# Patient Record
Sex: Female | Born: 1955 | Race: Black or African American | Hispanic: No | Marital: Married | Smoking: Former smoker
Health system: Southern US, Community
[De-identification: ages and names within clinical notes are randomized; demographics above are authoritative.]

## PROBLEM LIST (undated history)

## (undated) DIAGNOSIS — R161 Splenomegaly, not elsewhere classified: Secondary | ICD-10-CM

## (undated) DIAGNOSIS — D571 Sickle-cell disease without crisis: Secondary | ICD-10-CM

## (undated) DIAGNOSIS — J439 Emphysema, unspecified: Secondary | ICD-10-CM

## (undated) DIAGNOSIS — D649 Anemia, unspecified: Secondary | ICD-10-CM

## (undated) DIAGNOSIS — J329 Chronic sinusitis, unspecified: Secondary | ICD-10-CM

## (undated) DIAGNOSIS — I2699 Other pulmonary embolism without acute cor pulmonale: Secondary | ICD-10-CM

## (undated) DIAGNOSIS — H409 Unspecified glaucoma: Secondary | ICD-10-CM

## (undated) DIAGNOSIS — I10 Essential (primary) hypertension: Secondary | ICD-10-CM

## (undated) HISTORY — PX: ABDOMINAL HYSTERECTOMY: SHX81

## (undated) HISTORY — PX: CHOLECYSTECTOMY: SHX55

---

## 2006-06-16 DIAGNOSIS — R161 Splenomegaly, not elsewhere classified: Secondary | ICD-10-CM

## 2006-06-16 HISTORY — DX: Splenomegaly, not elsewhere classified: R16.1

## 2008-06-16 DIAGNOSIS — H409 Unspecified glaucoma: Secondary | ICD-10-CM

## 2008-06-16 HISTORY — DX: Unspecified glaucoma: H40.9

## 2011-12-21 ENCOUNTER — Inpatient Hospital Stay (HOSPITAL_COMMUNITY)
Admission: AD | Admit: 2011-12-21 | Discharge: 2011-12-21 | Disposition: A | Discharge status: Home / Self Care | Payer: Managed Care, Other (non HMO) | Source: Ambulatory Visit | Attending: Obstetrics & Gynecology | Admitting: Obstetrics & Gynecology

## 2011-12-21 ENCOUNTER — Encounter (HOSPITAL_COMMUNITY): Payer: Self-pay

## 2011-12-21 DIAGNOSIS — M549 Dorsalgia, unspecified: Secondary | ICD-10-CM

## 2011-12-21 HISTORY — DX: Chronic sinusitis, unspecified: J32.9

## 2011-12-21 HISTORY — DX: Other pulmonary embolism without acute cor pulmonale: I26.99

## 2011-12-21 HISTORY — DX: Essential (primary) hypertension: I10

## 2011-12-21 LAB — URINALYSIS, ROUTINE W REFLEX MICROSCOPIC
Glucose, UA: NEGATIVE mg/dL
Ketones, ur: NEGATIVE mg/dL
Leukocytes, UA: NEGATIVE
Protein, ur: NEGATIVE mg/dL
Urobilinogen, UA: 1 mg/dL (ref 0.0–1.0)

## 2011-12-21 MED ORDER — OXYCODONE-ACETAMINOPHEN 5-325 MG PO TABS
2.0000 | ORAL_TABLET | Freq: Once | ORAL | Status: AC
  Administered 2011-12-21: 2 via ORAL
  Filled 2011-12-21: qty 2

## 2011-12-21 MED ORDER — OXYCODONE-ACETAMINOPHEN 5-325 MG PO TABS: 2.0000 | ORAL_TABLET | ORAL | Status: AC | PRN

## 2011-12-21 MED ORDER — CYCLOBENZAPRINE HCL 10 MG PO TABS: 10.0000 mg | ORAL_TABLET | Freq: Two times a day (BID) | ORAL | Status: AC | PRN

## 2011-12-21 MED ORDER — ONDANSETRON 8 MG PO TBDP
8.0000 mg | ORAL_TABLET | Freq: Once | ORAL | Status: AC
  Administered 2011-12-21: 8 mg via ORAL
  Filled 2011-12-21: qty 1

## 2011-12-21 NOTE — MAU Provider Note (Signed)
History     CSN: [id number]  Arrival date & time 12/21/11  4098   None     Chief Complaint  Patient presents with  . Back Pain  . Leg Pain    HPI Alexandra Andrade is a 56 y.o. female who presents to MAU for back pain. Sudden onset of pain yesterday when getting out of the shower. The pain is located in the right lumbar area and radiated down the right leg. She rates the pain as 10/10. No loss of control of bladder or bowels. Patient thought she may have a kidney infection so started taking Amoxicillin. Denies UTI symptoms. Hx of arthritis in both hips. Patient takes Warfarin as a result of PE in February. The history was provided by the patient.  Past Medical History  Diagnosis Date  . Pulmonary embolism   . Hypertension   . Recurrent sinus infections     Past Surgical History  Procedure Date  . Abdominal hysterectomy   . Cholecystectomy     No family history on file.  History  Substance Use Topics  . Smoking status: Never Smoker   . Smokeless tobacco: Not on file  . Alcohol Use: No    OB History    Grav Para Term Preterm Abortions TAB SAB Ect Mult Living   1 1        1       Review of Systems  Constitutional: Negative for fever, chills, diaphoresis and fatigue.  HENT: Negative for ear pain, congestion, sore throat, facial swelling, neck pain, neck stiffness, dental problem and sinus pressure.   Eyes: Negative for photophobia, pain, discharge and visual disturbance.  Respiratory: Negative for cough, chest tightness, shortness of breath and wheezing.   Cardiovascular: Negative for chest pain and palpitations.  Gastrointestinal: Negative for nausea, vomiting, abdominal pain, diarrhea, constipation and abdominal distention.  Genitourinary: Negative for dysuria, urgency, frequency, flank pain, vaginal bleeding, vaginal discharge and difficulty urinating.  Musculoskeletal: Positive for back pain. Negative for myalgias and gait problem.  Skin: Negative for color change  and rash.  Neurological: Negative for dizziness, speech difficulty, weakness, light-headedness, numbness and headaches.  Psychiatric/Behavioral: Negative for confusion and agitation. The patient is not nervous/anxious.     Allergies  Review of patient's allergies indicates no known allergies.  Home Medications  No current outpatient prescriptions on file.  BP 170/90  Pulse 46  Temp 98.9 F (37.2 C) (Oral)  Resp 18  Physical Exam  Nursing note and vitals reviewed. Constitutional: She is oriented to person, place, and time. She appears well-developed and well-nourished. No distress.  HENT:  Head: Normocephalic.  Eyes: EOM are normal.  Neck: Neck supple.  Cardiovascular: Normal rate.   Pulmonary/Chest: Effort normal.  Abdominal: Soft. There is no tenderness.  Musculoskeletal:       Pain with ROM of back and legs. Difficulty with ambulation due to pain. Using a cain for assistance with walking. Radial and pedal pulses equal and strong. Adequate circulation.  Neurological: She is alert and oriented to person, place, and time. She has normal strength and normal reflexes. No cranial nerve deficit or sensory deficit.  Skin: Skin is warm and dry.  Psychiatric: She has a normal mood and affect. Her behavior is normal. Judgment and thought content normal.   Results for orders placed during the hospital encounter of 12/21/11 (from the past 24 hour(s))  URINALYSIS, ROUTINE W REFLEX MICROSCOPIC     Status: Abnormal   Collection Time   12/21/11  9:50 AM  Component Value Range   Color, Urine YELLOW  YELLOW   APPearance CLEAR  CLEAR   Specific Gravity, Urine <1.005 (*) 1.005 - 1.030   pH 6.0  5.0 - 8.0   Glucose, UA NEGATIVE  NEGATIVE mg/dL   Hgb urine dipstick NEGATIVE  NEGATIVE   Bilirubin Urine NEGATIVE  NEGATIVE   Ketones, ur NEGATIVE  NEGATIVE mg/dL   Protein, ur NEGATIVE  NEGATIVE mg/dL   Urobilinogen, UA 1.0  0.0 - 1.0 mg/dL   Nitrite NEGATIVE  NEGATIVE   Leukocytes, UA  NEGATIVE  NEGATIVE   Assessment: Lower lumbar pain   Sciatica   Arthritis by history  Plan:  Percocet 5/325, 2 tabs now   Zofran 8 mg. ODT now   Rx Percocet   Rx Flexeril   Follow up with Ortho if symptoms worsen before then patient will go to [facility] or [place] ED for further evaluation.  I discussed with the patient in detail clinical findings and need for follow up. Patient voices understanding.   Re evaluation: patient beginning to feel better with heat and Percocet Will d/c home with Rx ED Course  Procedures   MDM

## 2011-12-21 NOTE — MAU Note (Signed)
Mid to lower back pain for several days thought was kidney infection took an antibiotic left over from dental work, pain was worse yesterday, patient groaning with pain.

## 2014-04-17 ENCOUNTER — Encounter (HOSPITAL_COMMUNITY): Payer: Self-pay

## 2015-03-12 ENCOUNTER — Other Ambulatory Visit: Payer: Self-pay | Admitting: Family Medicine

## 2015-03-12 DIAGNOSIS — Z1231 Encounter for screening mammogram for malignant neoplasm of breast: Secondary | ICD-10-CM

## 2015-08-01 ENCOUNTER — Encounter: Payer: Self-pay | Admitting: Podiatry

## 2015-08-01 ENCOUNTER — Ambulatory Visit (INDEPENDENT_AMBULATORY_CARE_PROVIDER_SITE_OTHER): Payer: Managed Care, Other (non HMO) | Admitting: Podiatry

## 2015-08-01 VITALS — BP 136/79 | HR 59 | Resp 14

## 2015-08-01 DIAGNOSIS — B351 Tinea unguium: Secondary | ICD-10-CM | POA: Diagnosis not present

## 2015-08-01 DIAGNOSIS — L84 Corns and callosities: Secondary | ICD-10-CM

## 2015-08-01 DIAGNOSIS — L600 Ingrowing nail: Secondary | ICD-10-CM

## 2015-08-01 NOTE — Progress Notes (Signed)
   Subjective:    Patient ID: Alexandra Andrade, female    DOB: 10-13-1955, 60 y.o.   MRN: [id number]  HPI this patient presents to the office with chief complaint of pain from her big toenail, right foot growing into the corners. She says that she constantly has to work at cleaning out the corners and the nail always regrows and grows further into the corner itself. She presents the office today for an evaluation of this nail. She also says that she has multiple calluses on the bottom of both feet, which she works on herself with the pumice stone. She presents to the office for an evaluation and treatment    Review of Systems     Objective:   Physical Exam GENERAL APPEARANCE: Alert, conversant. Appropriately groomed. No acute distress.  VASCULAR: Pedal pulses palpable at  [facility] and PT bilateral.  Capillary refill time is immediate to all digits,  Normal temperature gradient.  Digital hair growth is present bilateral  NEUROLOGIC: sensation is normal to 5.07 monofilament at 5/5 sites bilateral.  Light touch is intact bilateral, Muscle strength normal.  MUSCULOSKELETAL: acceptable muscle strength, tone and stability bilateral.  Intrinsic muscluature intact bilateral.  Rectus appearance of foot and digits noted bilateral. 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    DERMATOLOGIC: skin color, texture, and turgor are within normal limits.  No preulcerative lesions or ulcers  are seen, no interdigital maceration noted.  No open lesions present.  Digital nails are asymptomatic. No drainage noted. Callus sub 1st MPJ and pinch callus. NAILS  Marked incurvation medial and lateral borders right great toe.  Thick disfigured discolored nail right hallux.  No paronychia noted.       Assessment & Plan:  Onychomycosis Hallux Right  Plantar tyloma  IE  Nail surgery.  Treatment options and alternatives discussed.  Recommended permanent phenol matrixectomy and patient agreed.  Right hallux was prepped with alcohol  and a toe block of 3cc of 2% lidocaine plain was administered in a digital toe block. .  The toe was then prepped with betadine solution .  The offending nail border was then excised along both medial and lateral borders. and matrix tissue exposed.  Phenol was then applied to the matrix tissue followed by an alcohol wash.  Antibiotic ointment and a dry sterile dressing was applied.  The patient was dispensed instructions for aftercare. RTC 1 week.   Helane Gunther DPM     Helane Gunther DPM

## 2015-08-09 ENCOUNTER — Encounter: Payer: Self-pay | Admitting: Podiatry

## 2015-08-09 ENCOUNTER — Ambulatory Visit (INDEPENDENT_AMBULATORY_CARE_PROVIDER_SITE_OTHER): Payer: Managed Care, Other (non HMO) | Admitting: Podiatry

## 2015-08-09 VITALS — BP 104/63 | HR 63 | Resp 12

## 2015-08-09 DIAGNOSIS — Z09 Encounter for follow-up examination after completed treatment for conditions other than malignant neoplasm: Secondary | ICD-10-CM

## 2015-08-09 DIAGNOSIS — L84 Corns and callosities: Secondary | ICD-10-CM

## 2015-08-09 NOTE — Progress Notes (Signed)
Patient ID: Alexandra Andrade, female   DOB: 1956/04/14, 60 y.o.   MRN: [id number] This patient returns to the office following nail surgery one week ago.  The patient says toe has been soaked and bandaged as directed.  There has been improvement of the toe since the surgery has been performed. Patient says there is callus on both sides of nail which she says needs to be removed. The patient presents for continued evaluation and treatment.  GENERAL APPEARANCE: Alert, conversant. Appropriately groomed. No acute distress.  VASCULAR: Pedal pulses palpable at  [facility] and PT bilateral.  Capillary refill time is immediate to all digits,  Normal temperature gradient.    NEUROLOGIC: sensation is normal to 5.07 monofilament at 5/5 sites bilateral.  Light touch is intact bilateral, Muscle strength normal.  MUSCULOSKELETAL: acceptable muscle strength, tone and stability bilateral.  Intrinsic muscluature intact bilateral.  Rectus appearance of foot and digits noted bilateral.   DERMATOLOGIC: skin color, texture, and turgor are within normal limits.  No preulcerative lesions or ulcers  are seen, no interdigital maceration noted.   NAILS  There is necrotic tissue along the nail groove  In the absence of redness swelling and pain. Proximal nail borders were freed to allow for drainage.    DX  S/p nail surgery  ROV  Home instructions were discussed.  Patient to call the office if there are any questions or concerns. Offered patient antibiotics which she resisted.  Told to keep bandaging the toe and the nail needs 3-4 weeks for complete healing.  RTC prn.   Helane Gunther DPM

## 2015-09-07 ENCOUNTER — Encounter: Payer: Self-pay | Admitting: Podiatry

## 2015-09-07 ENCOUNTER — Ambulatory Visit (INDEPENDENT_AMBULATORY_CARE_PROVIDER_SITE_OTHER): Payer: Managed Care, Other (non HMO) | Admitting: Podiatry

## 2015-09-07 VITALS — BP 135/82 | HR 54 | Resp 14

## 2015-09-07 DIAGNOSIS — Z09 Encounter for follow-up examination after completed treatment for conditions other than malignant neoplasm: Secondary | ICD-10-CM

## 2015-09-07 NOTE — Progress Notes (Signed)
Subjective:     Patient ID: Alexandra Andrade, female   DOB: [birth year], 60 y.o.   MRN: [id number]  HPI this patient presents to the office with continued pain noted on her right great toe. She had surgery performed approximately 4 weeks ago and is still having pain at night. She states she's having throbbing pain noted in a blackened area on the outside border big toe, right foot. Patient has no redness, swelling, drainage or infection in the toe. She presents the office for continued evaluation and treatment   Review of Systems     Objective:   Physical Exam GENERAL APPEARANCE: Alert, conversant. Appropriately groomed. No acute distress.  VASCULAR: Pedal pulses are  palpable at  [facility] and PT bilateral.  Capillary refill time is immediate to all digits,  Normal temperature gradient.  Digital hair growth is present bilateral  NEUROLOGIC: sensation is normal to 5.07 monofilament at 5/5 sites bilateral.  Light touch is intact bilateral, Muscle strength normal.  MUSCULOSKELETAL: acceptable muscle strength, tone and stability bilateral.  Intrinsic muscluature intact bilateral.  Rectus appearance of foot and digits noted bilateral.   DERMATOLOGIC: skin color, texture, and turgor are within normal limits.  No preulcerative lesions or ulcers  are seen, no interdigital maceration noted.  No open lesions present.  . No drainage noted. NAILS  No evidence of swelling or infection.  Post inflammatory reaction latetal border right hallux.     Assessment:     S/p nail surgery     Plan:     ROV>  Told patient that her nail is healing normally with inflammatory reaction.  She should continue to improve.  In the meantime, her nails were thinned in both borders with dremel.   Helane GuntherGregory Jhanae Jaskowiak DPM

## 2016-09-28 ENCOUNTER — Encounter (HOSPITAL_COMMUNITY): Payer: Self-pay | Admitting: Emergency Medicine

## 2016-09-28 ENCOUNTER — Emergency Department (HOSPITAL_COMMUNITY)
Admission: EM | Admit: 2016-09-28 | Discharge: 2016-09-29 | Disposition: A | Discharge status: Home / Self Care | Payer: BLUE CROSS/BLUE SHIELD | Attending: Emergency Medicine | Admitting: Emergency Medicine

## 2016-09-28 DIAGNOSIS — R519 Headache, unspecified: Secondary | ICD-10-CM

## 2016-09-28 DIAGNOSIS — I10 Essential (primary) hypertension: Secondary | ICD-10-CM | POA: Insufficient documentation

## 2016-09-28 DIAGNOSIS — R1114 Bilious vomiting: Secondary | ICD-10-CM | POA: Diagnosis not present

## 2016-09-28 DIAGNOSIS — R51 Headache: Secondary | ICD-10-CM | POA: Diagnosis not present

## 2016-09-28 DIAGNOSIS — R112 Nausea with vomiting, unspecified: Secondary | ICD-10-CM | POA: Diagnosis present

## 2016-09-28 DIAGNOSIS — Z79899 Other long term (current) drug therapy: Secondary | ICD-10-CM | POA: Diagnosis not present

## 2016-09-28 DIAGNOSIS — R110 Nausea: Secondary | ICD-10-CM | POA: Insufficient documentation

## 2016-09-28 HISTORY — DX: Anemia, unspecified: D64.9

## 2016-09-28 HISTORY — DX: Sickle-cell disease without crisis: D57.1

## 2016-09-28 MED ORDER — ONDANSETRON 4 MG PO TBDP: 4.0000 mg | ORAL_TABLET | Freq: Once | ORAL | Status: DC | PRN

## 2016-09-28 MED ORDER — ONDANSETRON HCL 4 MG/2ML IJ SOLN
4.0000 mg | Freq: Once | INTRAMUSCULAR | Status: AC
  Administered 2016-09-29: 4 mg via INTRAVENOUS
  Filled 2016-09-28: qty 2

## 2016-09-28 MED ORDER — SODIUM CHLORIDE 0.9 % IV BOLUS (SEPSIS)
1000.0000 mL | Freq: Once | INTRAVENOUS | Status: AC
  Administered 2016-09-29: 1000 mL via INTRAVENOUS

## 2016-09-28 NOTE — ED Triage Notes (Signed)
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 Pt also reports fevers at home and a couple episodes of diarrhea. Denies abdominal pain or urinary symptoms.

## 2016-09-29 ENCOUNTER — Encounter: Payer: Self-pay | Admitting: Podiatry

## 2016-09-29 ENCOUNTER — Emergency Department (HOSPITAL_COMMUNITY): Payer: BLUE CROSS/BLUE SHIELD

## 2016-09-29 LAB — COMPREHENSIVE METABOLIC PANEL
ALK PHOS: 69 U/L (ref 38–126)
ALT: 16 U/L (ref 14–54)
AST: 31 U/L (ref 15–41)
Albumin: 4.2 g/dL (ref 3.5–5.0)
Anion gap: 10 (ref 5–15)
BUN: 11 mg/dL (ref 6–20)
CHLORIDE: 99 mmol/L — AB (ref 101–111)
CO2: 26 mmol/L (ref 22–32)
CREATININE: 1.02 mg/dL — AB (ref 0.44–1.00)
Calcium: 9 mg/dL (ref 8.9–10.3)
GFR calc Af Amer: >60 mL/min (ref >60)
GFR, EST NON AFRICAN AMERICAN: 59 mL/min — AB (ref >60)
Glucose, Bld: 123 mg/dL — ABNORMAL HIGH (ref 65–99)
Potassium: 3.5 mmol/L (ref 3.5–5.1)
Sodium: 135 mmol/L (ref 135–145)
Total Bilirubin: 2.2 mg/dL — ABNORMAL HIGH (ref 0.3–1.2)
Total Protein: 7.9 g/dL (ref 6.5–8.1)

## 2016-09-29 LAB — CBC
HCT: 32.5 % — ABNORMAL LOW (ref 36.0–46.0)
Hemoglobin: 11 g/dL — ABNORMAL LOW (ref 12.0–15.0)
MCH: 28.1 pg (ref 26.0–34.0)
MCHC: 33.8 g/dL (ref 30.0–36.0)
MCV: 82.9 fL (ref 78.0–100.0)
Platelets: 349 10*3/uL (ref 150–400)
RBC: 3.92 MIL/uL (ref 3.87–5.11)
RDW: 16.1 % — ABNORMAL HIGH (ref 11.5–15.5)
WBC: 11.4 10*3/uL — AB (ref 4.0–10.5)

## 2016-09-29 LAB — URINALYSIS, ROUTINE W REFLEX MICROSCOPIC
BILIRUBIN URINE: NEGATIVE
Glucose, UA: NEGATIVE mg/dL
KETONES UR: NEGATIVE mg/dL
LEUKOCYTES UA: NEGATIVE
NITRITE: NEGATIVE
Protein, ur: NEGATIVE mg/dL
RBC / HPF: NONE SEEN RBC/hpf (ref 0–5)
Specific Gravity, Urine: 1.012 (ref 1.005–1.030)
pH: 5 (ref 5.0–8.0)

## 2016-09-29 LAB — RETICULOCYTES
RBC.: 3.83 MIL/uL — AB (ref 3.87–5.11)
RETIC CT PCT: 3.1 % (ref 0.4–3.1)
Retic Count, Absolute: 118.7 10*3/uL (ref 19.0–186.0)

## 2016-09-29 LAB — LIPASE, BLOOD: LIPASE: 27 U/L (ref 11–51)

## 2016-09-29 MED ORDER — ONDANSETRON 4 MG PO TBDP: 4.0000 mg | ORAL_TABLET | Freq: Three times a day (TID) | ORAL | 0 refills | Status: DC | PRN

## 2016-09-29 MED ORDER — METOCLOPRAMIDE HCL 5 MG/ML IJ SOLN
10.0000 mg | Freq: Once | INTRAMUSCULAR | Status: AC
  Administered 2016-09-29: 10 mg via INTRAVENOUS
  Filled 2016-09-29: qty 2

## 2016-09-29 MED ORDER — DEXAMETHASONE SODIUM PHOSPHATE 10 MG/ML IJ SOLN
10.0000 mg | Freq: Once | INTRAMUSCULAR | Status: AC
  Administered 2016-09-29: 10 mg via INTRAVENOUS
  Filled 2016-09-29: qty 1

## 2016-09-29 MED ORDER — MORPHINE SULFATE (PF) 4 MG/ML IV SOLN
4.0000 mg | Freq: Once | INTRAVENOUS | Status: AC
  Administered 2016-09-29: 4 mg via INTRAVENOUS
  Filled 2016-09-29: qty 1

## 2016-09-29 MED ORDER — DIPHENHYDRAMINE HCL 50 MG/ML IJ SOLN
12.5000 mg | Freq: Once | INTRAMUSCULAR | Status: AC
  Administered 2016-09-29: 12.5 mg via INTRAVENOUS
  Filled 2016-09-29: qty 1

## 2016-09-29 MED ORDER — SODIUM CHLORIDE 0.45 % IV SOLN
INTRAVENOUS | Status: DC
  Administered 2016-09-29: 02:00:00 via INTRAVENOUS

## 2016-09-29 NOTE — ED Provider Notes (Signed)
[facility]-EMERGENCY DEPT Provider Note   CSN: [id number] Arrival date & time: 09/28/16  2331     History   Chief Complaint Chief Complaint  Patient presents with  . Emesis    HPI Rosell Khouri is a 61 y.o. female.  Patient presents with nausea and vomiting x 3-4 days. She denies diarrhea and has not moved her bowels in 2 days. No abdominal pain, back pain, chest pain or SOB. She is here with significant other who reports she is keeping the house freezing so he feels she has had a fever. No hematemesis. She reports onset of headache after vomiting that is frontal, sharp. No visual changes. No history of headaches.    The history is provided by the patient. No language interpreter was used.  Emesis   Associated symptoms include headaches. Pertinent negatives include no abdominal pain, no chills, no diarrhea and no fever.    Past Medical History:  Diagnosis Date  . Anemia   . Hypertension   . Sickle cell anemia (HCC)     There are no active problems to display for this patient.   Past Surgical History:  Procedure Laterality Date  . CHOLECYSTECTOMY      OB History    No data available       Home Medications    Prior to Admission medications   Not on File    Family History No family history on file.  Social History Social History  Substance Use Topics  . Smoking status: Never Smoker  . Smokeless tobacco: Never Used  . Alcohol use No     Allergies   Patient has no known allergies.   Review of Systems Review of Systems  Constitutional: Negative for chills, diaphoresis and fever.  HENT: Negative.   Respiratory: Negative.  Negative for shortness of breath.   Cardiovascular: Negative.  Negative for chest pain.  Gastrointestinal: Positive for nausea and vomiting. Negative for abdominal pain and diarrhea.  Musculoskeletal: Negative.   Skin: Negative.   Neurological: Positive for weakness and headaches. Negative for syncope.     Physical Exam Updated  Vital Signs BP 138/80 (BP Location: Left Arm)   Pulse 82   Temp 100.2 F (37.9 C) (Oral)   Resp 18   SpO2 95%   Physical Exam  Constitutional: She is oriented to person, place, and time. She appears well-developed and well-nourished.  HENT:  Head: Normocephalic.  Mouth/Throat: Mucous membranes are not dry.  Neck: Normal range of motion. Neck supple.  Cardiovascular: Normal rate and regular rhythm.   No murmur heard. Pulmonary/Chest: Effort normal and breath sounds normal. She has no wheezes. She has no rales.  Abdominal: Soft. Bowel sounds are normal. There is no tenderness. There is no rebound and no guarding.  Musculoskeletal: Normal range of motion.  Neurological: She is alert and oriented to person, place, and time. No sensory deficit. She exhibits normal muscle tone. Coordination normal.  Skin: Skin is warm and dry.  Psychiatric: She has a normal mood and affect.     ED Treatments / Results  Labs (all labs ordered are listed, but only abnormal results are displayed) Labs Reviewed  COMPREHENSIVE METABOLIC PANEL - Abnormal; Notable for the following:       Result Value   Chloride 99 (*)    Glucose, Bld 123 (*)    Creatinine, Ser 1.02 (*)    Total Bilirubin 2.2 (*)    GFR calc non Af Amer 59 (*)    All other components within  normal limits  CBC - Abnormal; Notable for the following:    WBC 11.4 (*)    Hemoglobin 11.0 (*)    HCT 32.5 (*)    RDW 16.1 (*)    All other components within normal limits  LIPASE, BLOOD  URINALYSIS, ROUTINE W REFLEX MICROSCOPIC    EKG  EKG Interpretation None       Radiology No results found.  Procedures Procedures (including critical care time)  Medications Ordered in ED Medications  sodium chloride 0.9 % bolus 1,000 mL (1,000 mLs Intravenous New Bag/Given 09/29/16 0014)  ondansetron (ZOFRAN) injection 4 mg (4 mg Intravenous Given 09/29/16 0014)     Initial Impression / Assessment and Plan / ED Course  I have reviewed the  triage vital signs and the nursing notes.  Pertinent labs & imaging results that were available during my care of the patient were reviewed by me and considered in my medical decision making (see chart for details).     Patient with nausea and vomiting x 3 days with onset headache today. No measured temperature but she has "felt hot". History of sickle cell disease. No cough or chest pain.  IVF's provided in the ED with zofran. Nausea is resolved. She is tolerating PO fluids without further vomiting. She complains of sharp frontal headache, onset after vomiting. No neurologic deficits. Negative Head CT. Headache pain resolved with headache cocktail with decadron. She is felt stable for discharge home.  Final Clinical Impressions(s) / ED Diagnoses   Final diagnoses:  None   1. Nausea and vomiting 2. Headache, resolved  New Prescriptions New Prescriptions   No medications on file     Elpidio Anis, PA-C 09/29/16 0507    Layla Maw Ward, DO 09/29/16 [phone]

## 2016-09-29 NOTE — ED Notes (Signed)
Patient transported to CT 

## 2016-09-29 NOTE — ED Notes (Signed)
Pt reports not being able to keep anything down and is vomiting. Pt reports this all started on Friday. Pt denies any OTC medications.

## 2017-02-09 ENCOUNTER — Emergency Department (HOSPITAL_COMMUNITY): Payer: BLUE CROSS/BLUE SHIELD

## 2017-02-09 ENCOUNTER — Encounter (HOSPITAL_COMMUNITY): Payer: Self-pay | Admitting: Emergency Medicine

## 2017-02-09 ENCOUNTER — Observation Stay (HOSPITAL_COMMUNITY)
Admission: EM | Admit: 2017-02-09 | Discharge: 2017-02-10 | Disposition: A | Discharge status: Home / Self Care | Payer: BLUE CROSS/BLUE SHIELD | Attending: Internal Medicine | Admitting: Internal Medicine

## 2017-02-09 DIAGNOSIS — R079 Chest pain, unspecified: Secondary | ICD-10-CM | POA: Diagnosis present

## 2017-02-09 DIAGNOSIS — R001 Bradycardia, unspecified: Secondary | ICD-10-CM | POA: Insufficient documentation

## 2017-02-09 DIAGNOSIS — R071 Chest pain on breathing: Secondary | ICD-10-CM | POA: Diagnosis present

## 2017-02-09 DIAGNOSIS — J439 Emphysema, unspecified: Secondary | ICD-10-CM | POA: Diagnosis not present

## 2017-02-09 DIAGNOSIS — Z79899 Other long term (current) drug therapy: Secondary | ICD-10-CM | POA: Insufficient documentation

## 2017-02-09 DIAGNOSIS — R0902 Hypoxemia: Secondary | ICD-10-CM | POA: Diagnosis not present

## 2017-02-09 DIAGNOSIS — R7989 Other specified abnormal findings of blood chemistry: Secondary | ICD-10-CM | POA: Insufficient documentation

## 2017-02-09 DIAGNOSIS — D638 Anemia in other chronic diseases classified elsewhere: Secondary | ICD-10-CM | POA: Diagnosis present

## 2017-02-09 DIAGNOSIS — Z87891 Personal history of nicotine dependence: Secondary | ICD-10-CM | POA: Diagnosis not present

## 2017-02-09 DIAGNOSIS — D5700 Hb-SS disease with crisis, unspecified: Secondary | ICD-10-CM

## 2017-02-09 DIAGNOSIS — Z86711 Personal history of pulmonary embolism: Secondary | ICD-10-CM | POA: Diagnosis not present

## 2017-02-09 DIAGNOSIS — I10 Essential (primary) hypertension: Secondary | ICD-10-CM | POA: Insufficient documentation

## 2017-02-09 DIAGNOSIS — D57219 Sickle-cell/Hb-C disease with crisis, unspecified: Principal | ICD-10-CM | POA: Insufficient documentation

## 2017-02-09 DIAGNOSIS — R072 Precordial pain: Secondary | ICD-10-CM

## 2017-02-09 DIAGNOSIS — D571 Sickle-cell disease without crisis: Secondary | ICD-10-CM | POA: Diagnosis present

## 2017-02-09 HISTORY — DX: Emphysema, unspecified: J43.9

## 2017-02-09 HISTORY — DX: Splenomegaly, not elsewhere classified: R16.1

## 2017-02-09 HISTORY — DX: Unspecified glaucoma: H40.9

## 2017-02-09 LAB — COMPREHENSIVE METABOLIC PANEL
ALT: 17 U/L (ref 14–54)
ANION GAP: 8 (ref 5–15)
AST: 25 U/L (ref 15–41)
Albumin: 3.9 g/dL (ref 3.5–5.0)
Alkaline Phosphatase: 72 U/L (ref 38–126)
BUN: 15 mg/dL (ref 6–20)
CALCIUM: 9.6 mg/dL (ref 8.9–10.3)
CHLORIDE: 104 mmol/L (ref 101–111)
CO2: 26 mmol/L (ref 22–32)
CREATININE: 0.91 mg/dL (ref 0.44–1.00)
Glucose, Bld: 98 mg/dL (ref 65–99)
Potassium: 3.6 mmol/L (ref 3.5–5.1)
SODIUM: 138 mmol/L (ref 135–145)
Total Bilirubin: 1.7 mg/dL — ABNORMAL HIGH (ref 0.3–1.2)
Total Protein: 7.1 g/dL (ref 6.5–8.1)

## 2017-02-09 LAB — CBC
HCT: 29.3 % — ABNORMAL LOW (ref 36.0–46.0)
Hemoglobin: 10.2 g/dL — ABNORMAL LOW (ref 12.0–15.0)
MCH: 28.2 pg (ref 26.0–34.0)
MCHC: 34.8 g/dL (ref 30.0–36.0)
MCV: 80.9 fL (ref 78.0–100.0)
PLATELETS: 412 10*3/uL — AB (ref 150–400)
RBC: 3.62 MIL/uL — ABNORMAL LOW (ref 3.87–5.11)
RDW: 15.8 % — ABNORMAL HIGH (ref 11.5–15.5)
WBC: 10.4 10*3/uL (ref 4.0–10.5)

## 2017-02-09 LAB — RETICULOCYTES
RBC.: 3.62 MIL/uL — AB (ref 3.87–5.11)
Retic Count, Absolute: 210 10*3/uL — ABNORMAL HIGH (ref 19.0–186.0)
Retic Ct Pct: 5.8 % — ABNORMAL HIGH (ref 0.4–3.1)

## 2017-02-09 LAB — I-STAT TROPONIN, ED: TROPONIN I, POC: 0 ng/mL (ref 0.00–0.08)

## 2017-02-09 LAB — D-DIMER, QUANTITATIVE (NOT AT ARMC): D DIMER QUANT: 1.25 ug{FEU}/mL — AB (ref 0.00–0.50)

## 2017-02-09 LAB — TROPONIN I
Troponin I: <0.03 ng/mL (ref <0.03)
Troponin I: <0.03 ng/mL (ref <0.03)

## 2017-02-09 MED ORDER — POLYETHYLENE GLYCOL 3350 17 G PO PACK
17.0000 g | PACK | Freq: Every day | ORAL | Status: DC | PRN
  Filled 2017-02-09: qty 1

## 2017-02-09 MED ORDER — ONDANSETRON HCL 4 MG/2ML IJ SOLN
4.0000 mg | Freq: Four times a day (QID) | INTRAMUSCULAR | Status: DC | PRN
Start: 2017-02-09 — End: 2017-02-10
  Administered 2017-02-09 – 2017-02-10 (×2): 4 mg via INTRAVENOUS
  Filled 2017-02-09 (×2): qty 2

## 2017-02-09 MED ORDER — OXYCODONE-ACETAMINOPHEN 5-325 MG PO TABS: 1.0000 | ORAL_TABLET | Freq: Once | ORAL | Status: DC

## 2017-02-09 MED ORDER — OXYCODONE HCL 5 MG PO TABS
5.0000 mg | ORAL_TABLET | ORAL | Status: DC
  Administered 2017-02-09 – 2017-02-10 (×4): 5 mg via ORAL
  Filled 2017-02-09 (×4): qty 1

## 2017-02-09 MED ORDER — ENOXAPARIN SODIUM 40 MG/0.4ML ~~LOC~~ SOLN
40.0000 mg | SUBCUTANEOUS | Status: DC
  Administered 2017-02-09: 40 mg via SUBCUTANEOUS
  Filled 2017-02-09: qty 0.4

## 2017-02-09 MED ORDER — IRBESARTAN 150 MG PO TABS
150.0000 mg | ORAL_TABLET | Freq: Every day | ORAL | Status: DC
  Administered 2017-02-09 – 2017-02-10 (×2): 150 mg via ORAL
  Filled 2017-02-09 (×2): qty 1

## 2017-02-09 MED ORDER — KETOROLAC TROMETHAMINE 30 MG/ML IJ SOLN
30.0000 mg | Freq: Four times a day (QID) | INTRAMUSCULAR | Status: DC
  Administered 2017-02-09 – 2017-02-10 (×4): 30 mg via INTRAVENOUS
  Filled 2017-02-09 (×4): qty 1

## 2017-02-09 MED ORDER — SENNOSIDES-DOCUSATE SODIUM 8.6-50 MG PO TABS
1.0000 | ORAL_TABLET | Freq: Two times a day (BID) | ORAL | Status: DC
  Administered 2017-02-09 – 2017-02-10 (×2): 1 via ORAL
  Filled 2017-02-09 (×2): qty 1

## 2017-02-09 MED ORDER — HYDROMORPHONE HCL 1 MG/ML IJ SOLN
1.0000 mg | Freq: Once | INTRAMUSCULAR | Status: AC
  Administered 2017-02-09: 1 mg via INTRAVENOUS
  Filled 2017-02-09: qty 1

## 2017-02-09 MED ORDER — HYDROCHLOROTHIAZIDE 12.5 MG PO CAPS
12.5000 mg | ORAL_CAPSULE | Freq: Every day | ORAL | Status: DC
  Administered 2017-02-09 – 2017-02-10 (×2): 12.5 mg via ORAL
  Filled 2017-02-09 (×2): qty 1

## 2017-02-09 MED ORDER — ACETAMINOPHEN 325 MG PO TABS: 650.0000 mg | ORAL_TABLET | ORAL | Status: DC | PRN

## 2017-02-09 MED ORDER — DEXTROSE-NACL 5-0.45 % IV SOLN
INTRAVENOUS | Status: DC
  Administered 2017-02-09 – 2017-02-10 (×3): via INTRAVENOUS

## 2017-02-09 MED ORDER — IOPAMIDOL (ISOVUE-370) INJECTION 76%
INTRAVENOUS | Status: AC
  Administered 2017-02-09: 100 mL
  Filled 2017-02-09: qty 100

## 2017-02-09 MED ORDER — PROMETHAZINE HCL 25 MG/ML IJ SOLN
12.5000 mg | Freq: Once | INTRAMUSCULAR | Status: AC
  Administered 2017-02-09: 12.5 mg via INTRAVENOUS
  Filled 2017-02-09: qty 1

## 2017-02-09 MED ORDER — SODIUM CHLORIDE 0.9 % IV BOLUS (SEPSIS)
500.0000 mL | Freq: Once | INTRAVENOUS | Status: AC
  Administered 2017-02-09: 500 mL via INTRAVENOUS

## 2017-02-09 MED ORDER — HYDROMORPHONE HCL-NACL 0.5-0.9 MG/ML-% IV SOSY
1.0000 mg | PREFILLED_SYRINGE | Freq: Once | INTRAVENOUS | Status: AC
  Administered 2017-02-09: 1 mg via INTRAVENOUS
  Filled 2017-02-09: qty 2

## 2017-02-09 MED ORDER — MELOXICAM 15 MG PO TABS
15.0000 mg | ORAL_TABLET | Freq: Every day | ORAL | Status: DC | PRN
  Filled 2017-02-09: qty 1

## 2017-02-09 NOTE — Discharge Instructions (Signed)
Transfer to [place].  °

## 2017-02-09 NOTE — ED Provider Notes (Addendum)
[facility]-EMERGENCY DEPT Provider Note   CSN: [id number] Arrival date & time: 02/09/17  8299     History   Chief Complaint Chief Complaint  Patient presents with  . Chest Pain    HPI Alexandra Andrade is a 61 y.o. female.  Patient with hx sickle cell anemia, c/o left chest pain for the past 2 days. Pain occurs at rest. Constant, moderate. Worse w certain movement and deep breath. Mildly sob. No exertional chest pain.   Denies nv or diaphoresis. No leg pain or swelling. Remote hx PE when on prior trip to [place]. Denies recent sickle cell crisis. Occasional non prod cough. No sore throat. No fever or chills. No chest wall injury or strain. No heartburn. No personal or fam hx cad.    The history is provided by the patient.  Chest Pain   Associated symptoms include shortness of breath. Pertinent negatives include no abdominal pain, no back pain, no fever, no headaches and no vomiting.    Past Medical History:  Diagnosis Date  . Anemia   . Hypertension   . Pulmonary embolism (HCC)   . Recurrent sinus infections   . Sickle cell anemia (HCC)     There are no active problems to display for this patient.   Past Surgical History:  Procedure Laterality Date  . ABDOMINAL HYSTERECTOMY    . CHOLECYSTECTOMY      OB History    Gravida Para Term Preterm AB Living   1 1 0 0 0     SAB TAB Ectopic Multiple Live Births   0 0 0           Home Medications    Prior to Admission medications   Medication Sig Start Date End Date Taking? Authorizing Provider  amLODipine (NORVASC) 5 MG tablet Take by mouth. 08/28/14 08/28/15  [provider]  amLODipine (NORVASC) 5 MG tablet Take 5 mg by mouth daily.    [provider]  fluticasone (FLONASE) 50 MCG/ACT nasal spray Place 2 sprays into the nose as needed. For allergies    [provider]  folic acid (FOLVITE) 1 MG tablet Take 2 mg by mouth daily.    [provider]  folic acid (FOLVITE) 1 MG tablet Take  1-2 mg by mouth daily.    [provider]  hydrochlorothiazide (HYDRODIURIL) 12.5 MG tablet Take 12.5 mg by mouth daily.    [provider]  hydrochlorothiazide (MICROZIDE) 12.5 MG capsule Take 12.5 mg by mouth daily.    [provider]  ibuprofen (ADVIL,MOTRIN) 200 MG tablet Take 200-800 mg by mouth every 6 (six) hours as needed for moderate pain.    [provider]  irbesartan (AVAPRO) 150 MG tablet Take 150 mg by mouth daily.    [provider]  irbesartan (AVAPRO) 150 MG tablet Take 150 mg by mouth daily.    [provider]  ondansetron (ZOFRAN ODT) 4 MG disintegrating tablet Take 1 tablet (4 mg total) by mouth every 8 (eight) hours as needed for nausea or vomiting. 09/29/16   Elpidio Anis, PA-C  PRESCRIPTION MEDICATION Inhale 1 puff into the lungs 2 (two) times daily. Unknown inhaler (couldn't verify with pharmacy)    [provider]  traMADol (ULTRAM) 50 MG tablet Take 50 mg by mouth every 6 (six) hours as needed.    [provider]    Family History History reviewed. No pertinent family history.  Social History Social History  Substance Use Topics  . Smoking status:  Never Smoker  . Smokeless tobacco: Never Used  . Alcohol use No     Allergies   Patient has no known allergies.   Review of Systems Review of Systems  Constitutional: Negative for fever.  HENT: Negative for sore throat.   Eyes: Negative for redness.  Respiratory: Positive for shortness of breath.   Cardiovascular: Positive for chest pain.  Gastrointestinal: Negative for abdominal pain and vomiting.  Genitourinary: Negative for flank pain.  Musculoskeletal: Negative for back pain and neck pain.  Skin: Negative for rash.  Neurological: Negative for headaches.  Hematological: Does not bruise/bleed easily.  Psychiatric/Behavioral: Negative for confusion.     Physical Exam Updated Vital Signs BP (!) 154/86   Pulse (!) 51   Temp 97.9  F (36.6 C) (Oral)   Resp (!) 25   Ht 1.702 m (5\' 7" )   Wt 90.7 kg (200 lb)   SpO2 94%   BMI 31.32 kg/m   Physical Exam  Constitutional: She appears well-developed and well-nourished. No distress.  HENT:  Mouth/Throat: Oropharynx is clear and moist.  Eyes: Conjunctivae are normal. No scleral icterus.  Neck: Neck supple. No tracheal deviation present.  Cardiovascular: Normal rate, regular rhythm, normal heart sounds and intact distal pulses.  Exam reveals no gallop and no friction rub.   No murmur heard. Pulmonary/Chest: Effort normal and breath sounds normal. No respiratory distress. She exhibits tenderness.  Abdominal: Soft. Normal appearance and bowel sounds are normal. She exhibits no distension. There is no tenderness.  Genitourinary:  Genitourinary Comments: No cva tenderness  Musculoskeletal: She exhibits no edema or tenderness.  Neurological: She is alert.  Skin: Skin is warm and dry. No rash noted. She is not diaphoretic.  Psychiatric: She has a normal mood and affect.  Nursing note and vitals reviewed.    ED Treatments / Results  Labs (all labs ordered are listed, but only abnormal results are displayed) Results for orders placed or performed during the hospital encounter of 02/09/17  CBC  Result Value Ref Range   WBC 10.4 4.0 - 10.5 K/uL   RBC 3.62 (L) 3.87 - 5.11 MIL/uL   Hemoglobin 10.2 (L) 12.0 - 15.0 g/dL   HCT 86.5 (L) 78.4 - 69.6 %   MCV 80.9 78.0 - 100.0 fL   MCH 28.2 26.0 - 34.0 pg   MCHC 34.8 30.0 - 36.0 g/dL   RDW 29.5 (H) 28.4 - 13.2 %   Platelets 412 (H) 150 - 400 K/uL  Comprehensive metabolic panel  Result Value Ref Range   Sodium 138 135 - 145 mmol/L   Potassium 3.6 3.5 - 5.1 mmol/L   Chloride 104 101 - 111 mmol/L   CO2 26 22 - 32 mmol/L   Glucose, Bld 98 65 - 99 mg/dL   BUN 15 6 - 20 mg/dL   Creatinine, Ser 4.40 0.44 - 1.00 mg/dL   Calcium 9.6 8.9 - 10.2 mg/dL   Total Protein 7.1 6.5 - 8.1 g/dL   Albumin 3.9 3.5 - 5.0 g/dL   AST 25 15 -  41 U/L   ALT 17 14 - 54 U/L   Alkaline Phosphatase 72 38 - 126 U/L   Total Bilirubin 1.7 (H) 0.3 - 1.2 mg/dL   GFR calc non Af Amer >60 >60 mL/min   GFR calc Af Amer >60 >60 mL/min   Anion gap 8 5 - 15  D-dimer, quantitative (not at [facility])  Result Value Ref Range   D-Dimer, Quant 1.25 (H) 0.00 - 0.50 ug/mL-FEU  Reticulocytes  Result Value Ref Range   Retic Ct Pct 5.8 (H) 0.4 - 3.1 %   RBC. 3.62 (L) 3.87 - 5.11 MIL/uL   Retic Count, Absolute 210.0 (H) 19.0 - 186.0 K/uL  I-stat troponin, ED  Result Value Ref Range   Troponin i, poc 0.00 0.00 - 0.08 ng/mL   Comment 3            EKG  EKG Interpretation  Date/Time:  Monday February 09 2017 10:06:57 EDT Ventricular Rate:  47 PR Interval:    QRS Duration: 89 QT Interval:  456 QTC Calculation: 404 R Axis:   40 Text Interpretation:  Sinus bradycardia Nonspecific T wave abnormality Baseline wander in lead(s) II No previous tracing Confirmed by Cathren Laine ([id number]) on 02/09/2017 10:19:55 AM       Radiology Ct Angio Chest Pe W And/or Wo Contrast  Result Date: 02/09/2017 CLINICAL DATA:  Chest pain and shortness of breath.  History of PE. EXAM: CT ANGIOGRAPHY CHEST WITH CONTRAST TECHNIQUE: Multidetector CT imaging of the chest was performed using the standard protocol during bolus administration of intravenous contrast. Multiplanar CT image reconstructions and MIPs were obtained to evaluate the vascular anatomy. CONTRAST:  100 cc Isovue-370. COMPARISON:  None. FINDINGS: Cardiovascular: Negative for pulmonary embolus. Pulmonary arteries and heart are enlarged. No pericardial effusion. Mediastinum/Nodes: Mediastinal lymph nodes are not enlarged by CT size criteria. No hilar or axillary adenopathy. Esophagus is grossly unremarkable. Lungs/Pleura: Mild bullous emphysema. Image quality is degraded by expiratory phase imaging. Lungs are grossly clear. No pleural fluid. Airway is otherwise unremarkable. Upper Abdomen: There is reflux of contrast into  the hepatic veins. Visualized portions of the liver, adrenal glands and kidneys are otherwise unremarkable. Spleen is atrophic and calcified, consistent with a known history of sickle cell anemia. Visualized portions of the pancreas, stomach and bowel are grossly unremarkable. Musculoskeletal: Degenerative changes in the spine. No worrisome lytic or sclerotic lesions. Review of the MIP images confirms the above findings. IMPRESSION: 1. Negative for pulmonary embolus. 2. Enlarged pulmonary arteries, indicative of pulmonary arterial hypertension. 3.  Emphysema (ICD10-J43.9). Electronically Signed   By: Leanna Battles M.D.   On: 02/09/2017 13:08   Dg Chest Port 1 View  Result Date: 02/09/2017 CLINICAL DATA:  Chest pain and shortness of breath. EXAM: PORTABLE CHEST 1 VIEW COMPARISON:  None. FINDINGS: Heart size and pulmonary vascularity are normal and the lungs are clear. No effusions. Slight tortuosity of the thoracic aorta. No bone abnormality. IMPRESSION: No active disease. Electronically Signed   By: Francene Boyers M.D.   On: 02/09/2017 10:56    Procedures Procedures (including critical care time)  Medications Ordered in ED Medications  HYDROmorphone (DILAUDID) injection 1 mg (not administered)  sodium chloride 0.9 % bolus 500 mL (500 mLs Intravenous New Bag/Given 02/09/17 1040)     Initial Impression / Assessment and Plan / ED Course  I have reviewed the triage vital signs and the nursing notes.  Pertinent labs & imaging results that were available during my care of the patient were reviewed by me and considered in my medical decision making (see chart for details).  Iv ns 500 cc. Oxygen nasal cannula.   Dilaudid 1 mg iv.   Labs.  Reviewed nursing notes and prior charts for additional history.   Pain persists, additional dilaudid iv.   CT neg for PE.  With sickle cell, persistent chest pain, will admit for pain control, serial troponin.   Hospitalists indicate sickle cell pts  admitted at  [facility] to Dr Ashley Royalty - I discussed w Dr Ashley Royalty, plan for rule out, pain control, she accepts to [facility] to monitored bed.      Final Clinical Impressions(s) / ED Diagnoses   Final diagnoses:  None    New Prescriptions New Prescriptions   No medications on file       Cathren Laine, MD 02/09/17 1355

## 2017-02-09 NOTE — ED Triage Notes (Addendum)
Pt in from doc office via [facility] with c/o sharp L lateral rib/chest pain x 2 days. Pt went to doc office today for pain and was transported here for the chest pain eval. 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 HR 50, breathing shallow and 14/min. Pain is worse with movng or inspiration. A&ox4, VSS

## 2017-02-09 NOTE — H&P (Signed)
Hospital Admission Note Date: 02/09/2017  Patient name: Alexandra Andrade Medical record number: [id number] Date of birth: November 18, 1955 Age: 61 y.o. Gender: female PCP: Eartha Inch, MD  Attending physician: Altha Harm, MD  Chief Complaint:Pain in left chest wall x 2 days  History of Present Illness: This is an opiate naive patient with Hb [place] (noted in records from [place]) who presented with pain in left chest wall x 2 days. She took Ibuprofen for pain but had no relief. She states that last night the pain became worse and she noted severe pain with each breath. She denies palpitations, but does report some SOB. She describes the pain as pressure and at times sharp, non-radiating and at an intensity of 8/10. She denies fever, chills, N/V/D, dizziness, LE edema or HA. She is a former cigarette smoker and  was receiving medical marijuana prior to moving to [place]. However since medical marijuana not available in [place] she has been having more pain episodes. She has been prescribed Hydrocodone but has not been using it as she does not like how it causes her to feel.   In the ED she received several doses of Dilaudid and pain is still 8/10. I am asked to admit her for Chest pain to R/O Cardiac cause as well as for sickle cell crisis.   Scheduled Meds: . enoxaparin (LOVENOX) injection  40 mg Subcutaneous Q24H  . hydrochlorothiazide  12.5 mg Oral Daily  .  HYDROmorphone (DILAUDID) injection  1 mg Intravenous Once  . irbesartan  150 mg Oral Daily  . ketorolac  30 mg Intravenous Q6H  . oxyCODONE  5 mg Oral Q4H  . senna-docusate  1 tablet Oral BID   Continuous Infusions: . dextrose 5 % and 0.45% NaCl     PRN Meds:.acetaminophen, meloxicam, ondansetron (ZOFRAN) IV, polyethylene glycol Allergies: Patient has no known allergies. Past Medical History:  Diagnosis Date  . Anemia   . Emphysema of lung (HCC)   . Glaucoma 2010  . Hypertension   . Pulmonary embolism (HCC)   . Recurrent sinus  infections   . Sickle cell anemia (HCC)   . Spleen enlarged 2008   Past Surgical History:  Procedure Laterality Date  . ABDOMINAL HYSTERECTOMY    . CHOLECYSTECTOMY     Family History  Problem Relation Age of Onset  . Dementia Mother   . Cancer Father   . Sickle cell trait Son   . Lupus Sister    Social History   Social History  . Marital status: Married    Spouse name: N/A  . Number of children: N/A  . Years of education: N/A   Occupational History  . Not on file.   Social History Main Topics  . Smoking status: Former Smoker    Types: Cigarettes    Quit date: 02/09/2005  . Smokeless tobacco: Never Used  . Alcohol use No  . Drug use: Yes    Types: Marijuana     Comment: uses for pain  . Sexual activity: Yes   Other Topics Concern  . Not on file   Social History Narrative   ** Merged History Encounter **       Review of Systems: Pertinent items noted in HPI and remainder of comprehensive ROS otherwise negative. Physical Exam:  Intake/Output Summary (Last 24 hours) at 02/09/17 1733 Last data filed at 02/09/17 1115  Gross per 24 hour  Intake              500 ml  Output                0 ml  Net              500 ml   General: Alert, awake, oriented x3, in no acute distress.  HEENT: [place]/AT PEERL, EOMI Neck: Trachea midline,  no masses, no thyromegal,y no JVD, no carotid bruit OROPHARYNX:  Moist, No exudate/ erythema/lesions.  Heart: Regular rate and rhythm, without murmurs, rubs, gallops, PMI non-displaced, no heaves or thrills on palpation.  Lungs: Clear to auscultation, no wheezing or rhonchi noted. No increased vocal fremitus resonant to percussion  Abdomen: Soft, nontender, nondistended, positive bowel sounds, no masses no hepatosplenomegaly noted.  Neuro: No focal neurological deficits noted cranial nerves II through XII grossly intact. Strength at baseline in bilateral upper and lower extremities. Musculoskeletal: No warmth swelling or erythema around  joints, no spinal tenderness noted. Psychiatric: Patient alert and oriented x3, good insight and cognition, good recent to remote recall.   Lab results:  Recent Labs  02/09/17 1032  NA 138  K 3.6  CL 104  CO2 26  GLUCOSE 98  BUN 15  CREATININE 0.91  CALCIUM 9.6    Recent Labs  02/09/17 1032  AST 25  ALT 17  ALKPHOS 72  BILITOT 1.7*  PROT 7.1  ALBUMIN 3.9   No results for input(s): LIPASE, AMYLASE in the last 72 hours.  Recent Labs  02/09/17 1032  WBC 10.4  HGB 10.2*  HCT 29.3*  MCV 80.9  PLT 412*   No results for input(s): CKTOTAL, CKMB, CKMBINDEX, TROPONINI in the last 72 hours. Invalid input(s): POCBNP  Recent Labs  02/09/17 1032  DDIMER 1.25*   No results for input(s): HGBA1C in the last 72 hours. No results for input(s): CHOL, HDL, LDLCALC, TRIG, CHOLHDL, LDLDIRECT in the last 72 hours. No results for input(s): TSH, T4TOTAL, T3FREE, THYROIDAB in the last 72 hours.  Invalid input(s): FREET3  Recent Labs  02/09/17 1032  RETICCTPCT 5.8*   Imaging results:  Ct Angio Chest Pe W And/or Wo Contrast  Result Date: 02/09/2017 CLINICAL DATA:  Chest pain and shortness of breath.  History of PE. EXAM: CT ANGIOGRAPHY CHEST WITH CONTRAST TECHNIQUE: Multidetector CT imaging of the chest was performed using the standard protocol during bolus administration of intravenous contrast. Multiplanar CT image reconstructions and MIPs were obtained to evaluate the vascular anatomy. CONTRAST:  100 cc Isovue-370. COMPARISON:  None. FINDINGS: Cardiovascular: Negative for pulmonary embolus. Pulmonary arteries and heart are enlarged. No pericardial effusion. Mediastinum/Nodes: Mediastinal lymph nodes are not enlarged by CT size criteria. No hilar or axillary adenopathy. Esophagus is grossly unremarkable. Lungs/Pleura: Mild bullous emphysema. Image quality is degraded by expiratory phase imaging. Lungs are grossly clear. No pleural fluid. Airway is otherwise unremarkable. Upper  Abdomen: There is reflux of contrast into the hepatic veins. Visualized portions of the liver, adrenal glands and kidneys are otherwise unremarkable. Spleen is atrophic and calcified, consistent with a known history of sickle cell anemia. Visualized portions of the pancreas, stomach and bowel are grossly unremarkable. Musculoskeletal: Degenerative changes in the spine. No worrisome lytic or sclerotic lesions. Review of the MIP images confirms the above findings. IMPRESSION: 1. Negative for pulmonary embolus. 2. Enlarged pulmonary arteries, indicative of pulmonary arterial hypertension. 3.  Emphysema (ICD10-J43.9). Electronically Signed   By: Leanna Battles M.D.   On: 02/09/2017 13:08   Dg Chest Port 1 View  Result Date: 02/09/2017 CLINICAL DATA:  Chest pain and shortness of breath.  EXAM: PORTABLE CHEST 1 VIEW COMPARISON:  None. FINDINGS: Heart size and pulmonary vascularity are normal and the lungs are clear. No effusions. Slight tortuosity of the thoracic aorta. No bone abnormality. IMPRESSION: No active disease. Electronically Signed   By: Francene Boyers M.D.   On: 02/09/2017 10:56   Other results: ZOX:[id number], nonspecific ST and T waves changes.   Assessment and Plan: 1. Chest Pain: Given her risk factors of smoking, HTN and age I will obtain serial Troponin enzymes and continue Cardiac monitoring for the next 12 hours. Also obtain 2-D ECHO.  2. Hb [place] with Crisis: It is likely that the pain in her chest is related to sickle cell crisis. She is opiate naive so will give trial of scheduled percocet and intermittent Dilaudid. However is she r/o for cardiac causes and pain is still elevated tomorrow will transition to PCA.  3. Anemia of Chronic Disease: Per review of her labs from [facility], Hb is stable at baseline.  4. Elevated D-dimer: CTA is negative for D-Dimer. 5. Hypoxia: Pt was noted to have low saturations this morning in the ED. However saturations currently 98% on RA. This may have been  associated with hypoventilation after receiving IV opiates.  6. DVT Prophylaxis: Lovenox   MATTHEWS,MICHELLE A. 02/09/2017, 5:33 PM    MATTHEWS,MICHELLE A.  Pager [phone]. If 7PM-7AM, please contact night-coverage.

## 2017-02-09 NOTE — ED Provider Notes (Addendum)
Patient rechecked prior to transfer.  Pt remains alert, no distress.   Hr 48, rr 16. Pulse ox 95%.   Pt agreeable to transfer, bed ready.   Pt currently appears stable for transfer.       Cathren Laine, MD 02/09/17 [phone]

## 2017-02-10 ENCOUNTER — Observation Stay (HOSPITAL_BASED_OUTPATIENT_CLINIC_OR_DEPARTMENT_OTHER): Payer: BLUE CROSS/BLUE SHIELD

## 2017-02-10 DIAGNOSIS — D638 Anemia in other chronic diseases classified elsewhere: Secondary | ICD-10-CM | POA: Diagnosis not present

## 2017-02-10 DIAGNOSIS — J439 Emphysema, unspecified: Secondary | ICD-10-CM

## 2017-02-10 DIAGNOSIS — I5030 Unspecified diastolic (congestive) heart failure: Secondary | ICD-10-CM

## 2017-02-10 DIAGNOSIS — R071 Chest pain on breathing: Secondary | ICD-10-CM | POA: Diagnosis not present

## 2017-02-10 DIAGNOSIS — D57219 Sickle-cell/Hb-C disease with crisis, unspecified: Secondary | ICD-10-CM | POA: Diagnosis not present

## 2017-02-10 LAB — BASIC METABOLIC PANEL
ANION GAP: 5 (ref 5–15)
BUN: 16 mg/dL (ref 6–20)
CO2: 29 mmol/L (ref 22–32)
Calcium: 8.8 mg/dL — ABNORMAL LOW (ref 8.9–10.3)
Chloride: 104 mmol/L (ref 101–111)
Creatinine, Ser: 0.96 mg/dL (ref 0.44–1.00)
GFR calc Af Amer: >60 mL/min (ref >60)
GFR calc non Af Amer: >60 mL/min (ref >60)
GLUCOSE: 97 mg/dL (ref 65–99)
POTASSIUM: 3.8 mmol/L (ref 3.5–5.1)
Sodium: 138 mmol/L (ref 135–145)

## 2017-02-10 LAB — ECHOCARDIOGRAM COMPLETE
Height: 67 in
Weight: 3200 oz

## 2017-02-10 LAB — TROPONIN I: Troponin I: <0.03 ng/mL (ref <0.03)

## 2017-02-10 LAB — HIV ANTIBODY (ROUTINE TESTING W REFLEX): HIV Screen 4th Generation wRfx: NONREACTIVE

## 2017-02-10 MED ORDER — OXYCODONE HCL 5 MG PO TABS: 5.0000 mg | ORAL_TABLET | ORAL | 0 refills | Status: DC

## 2017-02-10 NOTE — Progress Notes (Addendum)
Nausea unrelieved by zofran 4mg  IV. Paged Provider on call. One time dose phenergan 12.5mg  IV ordered and administered. Phenergan effective.

## 2017-02-10 NOTE — Discharge Summary (Signed)
Alexandra Andrade MRN: [id number] DOB/AGE: 02-27-1956 61 y.o.  Admit date: 02/09/2017 Discharge date: 02/10/2017  Primary Care Physician:  Eartha Inch, MD   Discharge Diagnoses:   Patient Active Problem List   Diagnosis Date Noted  . Sickle cell anemia (HCC) 02/09/2017  . Chest pain on breathing 02/09/2017  . Emphysema lung (HCC) 02/09/2017  . Anemia of chronic disease 02/09/2017    DISCHARGE MEDICATION: Allergies as of 02/10/2017   No Known Allergies     Medication List    STOP taking these medications   amLODipine 5 MG tablet Commonly known as:  NORVASC   ibuprofen 200 MG tablet Commonly known as:  ADVIL,MOTRIN   ondansetron 4 MG disintegrating tablet Commonly known as:  ZOFRAN ODT     TAKE these medications   Albuterol Sulfate 108 (90 Base) MCG/ACT Aepb Inhale 1 puff into the lungs every 6 (six) hours as needed for wheezing.   folic acid 1 MG tablet Commonly known as:  FOLVITE Take 1-2 mg by mouth daily.   hydrochlorothiazide 12.5 MG tablet Commonly known as:  HYDRODIURIL Take 12.5 mg by mouth daily.   irbesartan 150 MG tablet Commonly known as:  AVAPRO Take 150 mg by mouth daily.   meloxicam 15 MG tablet Commonly known as:  MOBIC Take 15 mg by mouth daily as needed for pain.   oxyCODONE 5 MG immediate release tablet Commonly known as:  Oxy IR/ROXICODONE Take 1 tablet (5 mg total) by mouth every 4 (four) hours.            Discharge Care Instructions        Start     Ordered   02/10/17 0000  oxyCODONE (OXY IR/ROXICODONE) 5 MG immediate release tablet  Every 4 hours     02/10/17 1809   02/10/17 0000  Activity as tolerated - No restrictions     02/10/17 1809   02/10/17 0000  Diet general     02/10/17 1809        Consults:    SIGNIFICANT DIAGNOSTIC STUDIES:  Ct Angio Chest Pe W And/or Wo Contrast  Result Date: 02/09/2017 CLINICAL DATA:  Chest pain and shortness of breath.  History of PE. EXAM: CT ANGIOGRAPHY CHEST WITH CONTRAST  TECHNIQUE: Multidetector CT imaging of the chest was performed using the standard protocol during bolus administration of intravenous contrast. Multiplanar CT image reconstructions and MIPs were obtained to evaluate the vascular anatomy. CONTRAST:  100 cc Isovue-370. COMPARISON:  None. FINDINGS: Cardiovascular: Negative for pulmonary embolus. Pulmonary arteries and heart are enlarged. No pericardial effusion. Mediastinum/Nodes: Mediastinal lymph nodes are not enlarged by CT size criteria. No hilar or axillary adenopathy. Esophagus is grossly unremarkable. Lungs/Pleura: Mild bullous emphysema. Image quality is degraded by expiratory phase imaging. Lungs are grossly clear. No pleural fluid. Airway is otherwise unremarkable. Upper Abdomen: There is reflux of contrast into the hepatic veins. Visualized portions of the liver, adrenal glands and kidneys are otherwise unremarkable. Spleen is atrophic and calcified, consistent with a known history of sickle cell anemia. Visualized portions of the pancreas, stomach and bowel are grossly unremarkable. Musculoskeletal: Degenerative changes in the spine. No worrisome lytic or sclerotic lesions. Review of the MIP images confirms the above findings. IMPRESSION: 1. Negative for pulmonary embolus. 2. Enlarged pulmonary arteries, indicative of pulmonary arterial hypertension. 3.  Emphysema (ICD10-J43.9). Electronically Signed   By: Leanna Battles M.D.   On: 02/09/2017 13:08   Dg Chest Port 1 View  Result Date: 02/09/2017 CLINICAL DATA:  Chest pain  and shortness of breath. EXAM: PORTABLE CHEST 1 VIEW COMPARISON:  None. FINDINGS: Heart size and pulmonary vascularity are normal and the lungs are clear. No effusions. Slight tortuosity of the thoracic aorta. No bone abnormality. IMPRESSION: No active disease. Electronically Signed   By: Francene Boyers M.D.   On: 02/09/2017 10:56     ECHO:   Study Conclusions  - Left ventricle: The cavity size was normal. There was mild  focal   basal hypertrophy of the septum. Systolic function was normal.   The estimated ejection fraction was in the range of 60% to 65%.   Wall motion was normal; there were no regional wall motion   abnormalities. Doppler parameters are consistent with abnormal   left ventricular relaxation (grade 1 diastolic dysfunction). - Right ventricle: The cavity size was normal. Wall thickness was   normal    No results found for this or any previous visit (from the past 240 hour(s)).  BRIEF ADMITTING H & P: This is an opiate naive patient with Hb [place] (noted in records from [place]) who presented with pain in left chest wall x 2 days. She took Ibuprofen for pain but had no relief. She states that last night the pain became worse and she noted severe pain with each breath. She denies palpitations, but does report some SOB. She describes the pain as pressure and at times sharp, non-radiating and at an intensity of 8/10. She denies fever, chills, N/V/D, dizziness, LE edema or HA. She is a former cigarette smoker and  was receiving medical marijuana prior to moving to [place]. However since medical marijuana not available in [place] she has been having more pain episodes. She has been prescribed Hydrocodone but has not been using it as she does not like how it causes her to feel.   In the ED she received several doses of Dilaudid and pain is still 8/10. I am asked to admit her for Chest pain to R/O Cardiac cause as well as for sickle cell crisis.     Hospital Course:  Present on Admission: . Sickle cell anemia (HCC) . Chest pain on breathing . Emphysema lung (HCC) . Anemia of chronic disease  This is an opiate naive patient with Hb [place] who presented to the ED with left sided CP. An elevated D-dimer led to CTA which was negative for PE or any acute pulmonary conditions. There was a concern for cardiac cause of pain given her age and risk factors and she was brought in on observation for cardiac evaluation. Her  serial Troponin were negative as was her ECHO.  As the sickle cell crisis was treated the pain subsided. She was noted to have emphysema which was a surreptitious finding on CT.   Her pain of sickle cell crisis was managed with Oxycodone and at the time of discharge her pain was at a level of 3/10. She was discharged on Oxycodone 5 mg and given a prescription for 30 tabs which she is to use every 4 hours as needed for moderate to severe pain.   She has anemia of chronic disease and Hb was at baseline as established by review of her records.   She was continued on Avapro and HCTZ for management of her HTN. She was noted to have asymptomatic bradycardia   Disposition and Follow-up: Pt is discharged home in good condition and is to follow up with her Hematologist as needed or in 1 week. Discharge Instructions    Activity as tolerated - No  restrictions    Complete by:  As directed    Diet general    Complete by:  As directed       DISCHARGE EXAM:  General: Alert, awake, oriented x3, in no apparent distress.  HEENT: [place]/AT PEERL, EOMI, anicteric Neck: Trachea midline, no masses, no thyromegal,y no JVD, no carotid bruit OROPHARYNX: Moist, No exudate/ erythema/lesions.  Heart: Regular rate and rhythm, without murmurs, rubs, gallops or S3. PMI non-displaced. Exam reveals no decreased pulses. Pulmonary/Chest: Normal effort. Breath sounds normal. No. Apnea. Clear to auscultation,no stridor,  no wheezing and no rhonchi noted. No respiratory distress and no tenderness noted. Abdomen: Soft, nontender, nondistended, normal bowel sounds, no masses no hepatosplenomegaly noted. No fluid wave and no ascites. There is no guarding or rebound. Neuro: Alert and oriented to person, place and time. Normal motor skills, Displays no atrophy or tremors and exhibits normal muscle tone.  No focal neurological deficits noted cranial nerves II through XII grossly intact. No sensory deficit noted.  Strength at baseline  in bilateral upper and lower extremities. Gait normal. Musculoskeletal: No warmth swelling or erythema around joints, no spinal tenderness noted. Psychiatric: Patient alert and oriented x3, good insight and cognition, good recent to remote recall. Skin: Skin is warm and dry. No bruising, no ecchymosis and no rash noted. Pt is not diaphoretic. No erythema. No pallor    Blood pressure 116/68, pulse (!) 59, temperature 99.2 F (37.3 C), temperature source Oral, resp. rate 18, height 5\' 7"  (1.702 m), weight 90.7 kg (200 lb), SpO2 96 %.   Recent Labs  02/09/17 1032 02/10/17 0614  NA 138 138  K 3.6 3.8  CL 104 104  CO2 26 29  GLUCOSE 98 97  BUN 15 16  CREATININE 0.91 0.96  CALCIUM 9.6 8.8*    Recent Labs  02/09/17 1032  AST 25  ALT 17  ALKPHOS 72  BILITOT 1.7*  PROT 7.1  ALBUMIN 3.9   No results for input(s): LIPASE, AMYLASE in the last 72 hours.  Recent Labs  02/09/17 1032  WBC 10.4  HGB 10.2*  HCT 29.3*  MCV 80.9  PLT 412*     Total time spent including face to face and decision making was greater than 30 minutes  Signed: Burel Kahre A. 02/10/2017, 6:11 PM

## 2017-02-10 NOTE — Care Management Note (Signed)
Case Management Note  Patient Details  Name: [name] MRN: [id number] Date of Birth: 1956-01-11  Subjective/Objective:     Sickle cell and chest pain               Action/Plan: Date:  February 10, 2017 Chart reviewed for concurrent status and case management needs. Will continue to follow patient progress. Discharge Planning: following for needs Expected discharge date: [id number] Marcelle Smiling, BSN, [place], [place]   [phone]  Expected Discharge Date:   (unknown)               Expected Discharge Plan:  Home/Self Care  In-House Referral:     Discharge planning Services  CM Consult  Post Acute Care Choice:    Choice offered to:     DME Arranged:    DME Agency:     HH Arranged:    HH Agency:     Status of Service:  In process, will continue to follow  If discussed at Long Length of Stay Meetings, dates discussed:    Additional Comments:  Golda Acre, RN 02/10/2017, 8:38 AM

## 2017-02-10 NOTE — Progress Notes (Signed)
  Echocardiogram 2D Echocardiogram has been performed.  Leta Jungling M 02/10/2017, 10:46 AM

## 2017-02-12 LAB — HEMOGLOBINOPATHY EVALUATION
HGB C: 44.6 % — AB
HGB F QUANT: 0.7 % (ref 0.0–2.0)
HGB S QUANTITAION: 50.4 % — AB
Hgb A2 Quant: 4.3 % — ABNORMAL HIGH (ref 1.8–3.2)
Hgb A: 0 % — ABNORMAL LOW (ref 96.4–98.8)
Hgb Variant: 0 %

## 2017-12-27 ENCOUNTER — Emergency Department (HOSPITAL_COMMUNITY): Payer: BLUE CROSS/BLUE SHIELD

## 2017-12-27 ENCOUNTER — Other Ambulatory Visit: Payer: Self-pay

## 2017-12-27 ENCOUNTER — Inpatient Hospital Stay (HOSPITAL_COMMUNITY)
Admission: EM | Admit: 2017-12-27 | Discharge: 2017-12-29 | DRG: 812 | Disposition: A | Discharge status: Home / Self Care | Payer: BLUE CROSS/BLUE SHIELD | Attending: Internal Medicine | Admitting: Internal Medicine

## 2017-12-27 ENCOUNTER — Encounter (HOSPITAL_COMMUNITY): Payer: Self-pay | Admitting: Emergency Medicine

## 2017-12-27 DIAGNOSIS — H409 Unspecified glaucoma: Secondary | ICD-10-CM | POA: Diagnosis present

## 2017-12-27 DIAGNOSIS — D638 Anemia in other chronic diseases classified elsewhere: Secondary | ICD-10-CM | POA: Diagnosis present

## 2017-12-27 DIAGNOSIS — Z832 Family history of diseases of the blood and blood-forming organs and certain disorders involving the immune mechanism: Secondary | ICD-10-CM

## 2017-12-27 DIAGNOSIS — J90 Pleural effusion, not elsewhere classified: Secondary | ICD-10-CM | POA: Diagnosis present

## 2017-12-27 DIAGNOSIS — Z79899 Other long term (current) drug therapy: Secondary | ICD-10-CM | POA: Diagnosis not present

## 2017-12-27 DIAGNOSIS — R071 Chest pain on breathing: Secondary | ICD-10-CM | POA: Diagnosis not present

## 2017-12-27 DIAGNOSIS — J918 Pleural effusion in other conditions classified elsewhere: Secondary | ICD-10-CM | POA: Diagnosis present

## 2017-12-27 DIAGNOSIS — J439 Emphysema, unspecified: Secondary | ICD-10-CM | POA: Diagnosis present

## 2017-12-27 DIAGNOSIS — Z90710 Acquired absence of both cervix and uterus: Secondary | ICD-10-CM | POA: Diagnosis not present

## 2017-12-27 DIAGNOSIS — Z87891 Personal history of nicotine dependence: Secondary | ICD-10-CM | POA: Diagnosis not present

## 2017-12-27 DIAGNOSIS — Z9049 Acquired absence of other specified parts of digestive tract: Secondary | ICD-10-CM | POA: Diagnosis not present

## 2017-12-27 DIAGNOSIS — Z86711 Personal history of pulmonary embolism: Secondary | ICD-10-CM | POA: Diagnosis not present

## 2017-12-27 DIAGNOSIS — D5700 Hb-SS disease with crisis, unspecified: Secondary | ICD-10-CM | POA: Diagnosis present

## 2017-12-27 DIAGNOSIS — D57219 Sickle-cell/Hb-C disease with crisis, unspecified: Secondary | ICD-10-CM | POA: Diagnosis present

## 2017-12-27 DIAGNOSIS — F1290 Cannabis use, unspecified, uncomplicated: Secondary | ICD-10-CM | POA: Diagnosis present

## 2017-12-27 DIAGNOSIS — I10 Essential (primary) hypertension: Secondary | ICD-10-CM | POA: Diagnosis present

## 2017-12-27 LAB — CBC WITH DIFFERENTIAL/PLATELET
BASOS PCT: 0 %
Basophils Absolute: 0 10*3/uL (ref 0.0–0.1)
EOS ABS: 0.1 10*3/uL (ref 0.0–0.7)
Eosinophils Relative: 1 %
HCT: 33 % — ABNORMAL LOW (ref 36.0–46.0)
HEMOGLOBIN: 11.6 g/dL — AB (ref 12.0–15.0)
Lymphocytes Relative: 15 %
Lymphs Abs: 1.4 10*3/uL (ref 0.7–4.0)
MCH: 28.6 pg (ref 26.0–34.0)
MCHC: 35.2 g/dL (ref 30.0–36.0)
MCV: 81.5 fL (ref 78.0–100.0)
MONOS PCT: 11 %
Monocytes Absolute: 1 10*3/uL (ref 0.1–1.0)
NEUTROS PCT: 73 %
Neutro Abs: 6.9 10*3/uL (ref 1.7–7.7)
Platelets: 478 10*3/uL — ABNORMAL HIGH (ref 150–400)
RBC: 4.05 MIL/uL (ref 3.87–5.11)
RDW: 15.7 % — ABNORMAL HIGH (ref 11.5–15.5)
WBC: 9.4 10*3/uL (ref 4.0–10.5)

## 2017-12-27 LAB — URINALYSIS, ROUTINE W REFLEX MICROSCOPIC
BILIRUBIN URINE: NEGATIVE
GLUCOSE, UA: NEGATIVE mg/dL
HGB URINE DIPSTICK: NEGATIVE
KETONES UR: NEGATIVE mg/dL
Leukocytes, UA: NEGATIVE
NITRITE: NEGATIVE
PH: 7 (ref 5.0–8.0)
Protein, ur: NEGATIVE mg/dL
Specific Gravity, Urine: 1.015 (ref 1.005–1.030)

## 2017-12-27 LAB — COMPREHENSIVE METABOLIC PANEL
ALT: 13 U/L (ref 0–44)
ANION GAP: 9 (ref 5–15)
AST: 19 U/L (ref 15–41)
Albumin: 4.2 g/dL (ref 3.5–5.0)
Alkaline Phosphatase: 76 U/L (ref 38–126)
BUN: 13 mg/dL (ref 8–23)
CHLORIDE: 104 mmol/L (ref 98–111)
CO2: 28 mmol/L (ref 22–32)
CREATININE: 0.84 mg/dL (ref 0.44–1.00)
Calcium: 9.8 mg/dL (ref 8.9–10.3)
Glucose, Bld: 108 mg/dL — ABNORMAL HIGH (ref 70–99)
POTASSIUM: 3.9 mmol/L (ref 3.5–5.1)
Sodium: 141 mmol/L (ref 135–145)
Total Bilirubin: 2.1 mg/dL — ABNORMAL HIGH (ref 0.3–1.2)
Total Protein: 8.4 g/dL — ABNORMAL HIGH (ref 6.5–8.1)

## 2017-12-27 LAB — RETICULOCYTES
RBC.: 4.05 MIL/uL (ref 3.87–5.11)
RETIC CT PCT: 4.9 % — AB (ref 0.4–3.1)
Retic Count, Absolute: 198.5 10*3/uL — ABNORMAL HIGH (ref 19.0–186.0)

## 2017-12-27 LAB — I-STAT CG4 LACTIC ACID, ED: Lactic Acid, Venous: 1.2 mmol/L (ref 0.5–1.9)

## 2017-12-27 MED ORDER — NALOXONE HCL 0.4 MG/ML IJ SOLN: 0.4000 mg | INTRAMUSCULAR | Status: DC | PRN

## 2017-12-27 MED ORDER — OLMESARTAN-AMLODIPINE-HCTZ 40-10-25 MG PO TABS: 1.0000 | ORAL_TABLET | Freq: Every day | ORAL | Status: DC

## 2017-12-27 MED ORDER — DIPHENHYDRAMINE HCL 50 MG/ML IJ SOLN: 12.5000 mg | Freq: Four times a day (QID) | INTRAMUSCULAR | Status: DC | PRN

## 2017-12-27 MED ORDER — POLYETHYLENE GLYCOL 3350 17 G PO PACK: 17.0000 g | PACK | Freq: Every day | ORAL | Status: DC | PRN

## 2017-12-27 MED ORDER — HYDROCHLOROTHIAZIDE 25 MG PO TABS
25.0000 mg | ORAL_TABLET | Freq: Every day | ORAL | Status: DC
Start: 2017-12-27 — End: 2017-12-29
  Administered 2017-12-28: 25 mg via ORAL
  Filled 2017-12-27 (×2): qty 1

## 2017-12-27 MED ORDER — ONDANSETRON HCL 4 MG/2ML IJ SOLN
4.0000 mg | Freq: Once | INTRAMUSCULAR | Status: AC
  Administered 2017-12-27: 4 mg via INTRAVENOUS
  Filled 2017-12-27: qty 2

## 2017-12-27 MED ORDER — ENOXAPARIN SODIUM 40 MG/0.4ML ~~LOC~~ SOLN
40.0000 mg | SUBCUTANEOUS | Status: DC
  Administered 2017-12-27 – 2017-12-28 (×2): 40 mg via SUBCUTANEOUS
  Filled 2017-12-27 (×2): qty 0.4

## 2017-12-27 MED ORDER — SODIUM CHLORIDE 0.9 % IV BOLUS
500.0000 mL | Freq: Once | INTRAVENOUS | Status: AC
  Administered 2017-12-27: 500 mL via INTRAVENOUS

## 2017-12-27 MED ORDER — HYDROMORPHONE HCL 1 MG/ML IJ SOLN
1.0000 mg | Freq: Once | INTRAMUSCULAR | Status: AC
  Administered 2017-12-27: 1 mg via INTRAVENOUS
  Filled 2017-12-27: qty 1

## 2017-12-27 MED ORDER — AMLODIPINE BESYLATE 10 MG PO TABS
10.0000 mg | ORAL_TABLET | Freq: Every day | ORAL | Status: DC
Start: 2017-12-27 — End: 2017-12-29
  Administered 2017-12-28 – 2017-12-29 (×2): 10 mg via ORAL
  Filled 2017-12-27 (×2): qty 1

## 2017-12-27 MED ORDER — AZITHROMYCIN 500 MG IV SOLR
500.0000 mg | Freq: Once | INTRAVENOUS | Status: AC
  Administered 2017-12-27: 500 mg via INTRAVENOUS
  Filled 2017-12-27: qty 500

## 2017-12-27 MED ORDER — IOPAMIDOL (ISOVUE-370) INJECTION 76%
100.0000 mL | Freq: Once | INTRAVENOUS | Status: AC | PRN
  Administered 2017-12-27: 100 mL via INTRAVENOUS

## 2017-12-27 MED ORDER — OLMESARTAN MEDOXOMIL 40 MG PO TABS
40.0000 mg | ORAL_TABLET | Freq: Every day | ORAL | Status: DC
  Administered 2017-12-28 – 2017-12-29 (×2): 40 mg via ORAL
  Filled 2017-12-27 (×3): qty 1

## 2017-12-27 MED ORDER — SODIUM CHLORIDE 0.9 % IV SOLN
1.0000 g | INTRAVENOUS | Status: DC
  Administered 2017-12-28: 1 g via INTRAVENOUS
  Filled 2017-12-27: qty 1

## 2017-12-27 MED ORDER — DEXTROSE-NACL 5-0.45 % IV SOLN
INTRAVENOUS | Status: DC
  Administered 2017-12-27 – 2017-12-28 (×4): via INTRAVENOUS

## 2017-12-27 MED ORDER — DIPHENHYDRAMINE HCL 12.5 MG/5ML PO ELIX: 12.5000 mg | ORAL_SOLUTION | Freq: Four times a day (QID) | ORAL | Status: DC | PRN

## 2017-12-27 MED ORDER — ONDANSETRON HCL 4 MG/2ML IJ SOLN: 4.0000 mg | Freq: Four times a day (QID) | INTRAMUSCULAR | Status: DC | PRN

## 2017-12-27 MED ORDER — SODIUM CHLORIDE 0.9% FLUSH: 9.0000 mL | INTRAVENOUS | Status: DC | PRN

## 2017-12-27 MED ORDER — KETOROLAC TROMETHAMINE 30 MG/ML IJ SOLN
30.0000 mg | Freq: Four times a day (QID) | INTRAMUSCULAR | Status: DC
  Administered 2017-12-27 – 2017-12-29 (×7): 30 mg via INTRAVENOUS
  Filled 2017-12-27 (×7): qty 1

## 2017-12-27 MED ORDER — FOLIC ACID 1 MG PO TABS
1.0000 mg | ORAL_TABLET | Freq: Every day | ORAL | Status: DC
  Administered 2017-12-27 – 2017-12-28 (×2): 1 mg via ORAL
  Administered 2017-12-29: 2 mg via ORAL
  Filled 2017-12-27: qty 2
  Filled 2017-12-27 (×2): qty 1

## 2017-12-27 MED ORDER — IOPAMIDOL (ISOVUE-370) INJECTION 76%
INTRAVENOUS | Status: AC
  Filled 2017-12-27: qty 100

## 2017-12-27 MED ORDER — HYDROMORPHONE 1 MG/ML IV SOLN
INTRAVENOUS | Status: DC
  Administered 2017-12-27: 30 mg via INTRAVENOUS
  Administered 2017-12-28: 0 mg via INTRAVENOUS
  Filled 2017-12-27: qty 30

## 2017-12-27 MED ORDER — ALBUTEROL SULFATE (2.5 MG/3ML) 0.083% IN NEBU: 3.0000 mL | INHALATION_SOLUTION | Freq: Four times a day (QID) | RESPIRATORY_TRACT | Status: DC | PRN

## 2017-12-27 MED ORDER — SODIUM CHLORIDE 0.9 % IV SOLN
1.0000 g | Freq: Once | INTRAVENOUS | Status: AC
  Administered 2017-12-27: 1 g via INTRAVENOUS
  Filled 2017-12-27: qty 10

## 2017-12-27 MED ORDER — SENNOSIDES-DOCUSATE SODIUM 8.6-50 MG PO TABS
1.0000 | ORAL_TABLET | Freq: Two times a day (BID) | ORAL | Status: DC
  Administered 2017-12-27 – 2017-12-29 (×4): 1 via ORAL
  Filled 2017-12-27 (×4): qty 1

## 2017-12-27 MED ORDER — SODIUM CHLORIDE 0.9 % IV SOLN
500.0000 mg | INTRAVENOUS | Status: DC
  Administered 2017-12-28: 500 mg via INTRAVENOUS
  Filled 2017-12-27: qty 500

## 2017-12-27 NOTE — ED Notes (Addendum)
Pt reported pain in left side RN  made aware of pain and low BP

## 2017-12-27 NOTE — H&P (Signed)
Alexandra Andrade is an 62 y.o. female.    Chief Complaint: Left upper quadrant pain for 3 days  HPI: Patient is a 62 year old female with history of sickle cell disease [place] who has infrequent attacks but prior history of pulmonary embolism also who started having fever up to 103 about 3 days ago at home.  She concurrently started having pain in her left upper quadrant which has persisted.  Pain is rated as 10 out of 10 worse with any activities not relieved by rest associated with mild shortness of breath.  Patient is opiate nave and does not like taking pain medications.  She is still took some oxycodone the last 3 days with no relief.  She finally came to the ER where she was given IV Dilaudid PCA after 6 mg with no relief.  Work-up was done on with suspicion of possible splenic infarct or sequestration based on the location of her symptoms.  CT angiogram of the chest also done specifically looking for pulmonary embolism since patient had prior history.  Based on CT scan patient appears to have mild to moderate pleural effusion on the left side corresponding to the site of her pain.  She appears to be having pleuritic chest pain therefore in relation to the effusion.  No clear-cut pulmonary infiltrate to suggest pneumonia.  She is afebrile today.  White count is also only 9000.  Patient is not having pain in other areas consistent with her sickle cell disease.  She has myalgia only.  Past Medical History:  Diagnosis Date  . Anemia   . Emphysema of lung ([facility])   . Glaucoma 2010  . Hypertension   . Pulmonary embolism ([facility])   . Recurrent sinus infections   . Sickle cell anemia (HCC)   . Spleen enlarged 2008    Past Surgical History:  Procedure Laterality Date  . ABDOMINAL HYSTERECTOMY    . CHOLECYSTECTOMY      Family History  Problem Relation Age of Onset  . Dementia Mother   . Cancer Father   . Sickle cell trait Son   . Lupus Sister    Social History:  reports that she quit smoking  about 12 years ago. Her smoking use included cigarettes. She has never used smokeless tobacco. She reports that she has current or past drug history. Drug: Marijuana. She reports that she does not drink alcohol.  Allergies: No Known Allergies   (Not in a hospital admission)  Results for orders placed or performed during the hospital encounter of 12/27/17 (from the past 48 hour(s))  Comprehensive metabolic panel     Status: Abnormal   Collection Time: 12/27/17 10:22 AM  Result Value Ref Range   Sodium 141 135 - 145 mmol/L   Potassium 3.9 3.5 - 5.1 mmol/L   Chloride 104 98 - 111 mmol/L    Comment: Please note change in reference range.   CO2 28 22 - 32 mmol/L   Glucose, Bld 108 (H) 70 - 99 mg/dL    Comment: Please note change in reference range.   BUN 13 8 - 23 mg/dL    Comment: Please note change in reference range.   Creatinine, Ser 0.84 0.44 - 1.00 mg/dL   Calcium 9.8 8.9 - 10.3 mg/dL   Total Protein 8.4 (H) 6.5 - 8.1 g/dL   Albumin 4.2 3.5 - 5.0 g/dL   AST 19 15 - 41 U/L   ALT 13 0 - 44 U/L    Comment: Please note change in reference  range.   Alkaline Phosphatase 76 38 - 126 U/L   Total Bilirubin 2.1 (H) 0.3 - 1.2 mg/dL   GFR calc non Af Amer >60 >60 mL/min   GFR calc Af Amer >60 >60 mL/min    Comment: (NOTE) The eGFR has been calculated using the CKD EPI equation. This calculation has not been validated in all clinical situations. eGFR's persistently <60 mL/min signify possible Chronic Kidney Disease.    Anion gap 9 5 - 15    Comment: Performed at [facility], [place] [address], [place], [place] [address]  CBC with Differential     Status: Abnormal   Collection Time: 12/27/17 10:22 AM  Result Value Ref Range   WBC 9.4 4.0 - 10.5 K/uL   RBC 4.05 3.87 - 5.11 MIL/uL   Hemoglobin 11.6 (L) 12.0 - 15.0 g/dL   HCT 33.0 (L) 36.0 - 46.0 %   MCV 81.5 78.0 - 100.0 fL   MCH 28.6 26.0 - 34.0 pg   MCHC 35.2 30.0 - 36.0 g/dL   RDW 15.7 (H) 11.5 - 15.5 %    Platelets 478 (H) 150 - 400 K/uL   Neutrophils Relative % 73 %   Neutro Abs 6.9 1.7 - 7.7 K/uL   Lymphocytes Relative 15 %   Lymphs Abs 1.4 0.7 - 4.0 K/uL   Monocytes Relative 11 %   Monocytes Absolute 1.0 0.1 - 1.0 K/uL   Eosinophils Relative 1 %   Eosinophils Absolute 0.1 0.0 - 0.7 K/uL   Basophils Relative 0 %   Basophils Absolute 0.0 0.0 - 0.1 K/uL    Comment: Performed at [facility], [place] [address] [place], [place] [address]  Reticulocytes     Status: Abnormal   Collection Time: 12/27/17 10:22 AM  Result Value Ref Range   Retic Ct Pct 4.9 (H) 0.4 - 3.1 %   RBC. 4.05 3.87 - 5.11 MIL/uL   Retic Count, Absolute 198.5 (H) 19.0 - 186.0 K/uL    Comment: Performed at [facility], [place] [address], [place], [place] [address]  Urinalysis, Routine w reflex microscopic     Status: None   Collection Time: 12/27/17 10:23 AM  Result Value Ref Range   Color, Urine YELLOW YELLOW   APPearance CLEAR CLEAR   Specific Gravity, Urine 1.015 1.005 - 1.030   pH 7.0 5.0 - 8.0   Glucose, UA NEGATIVE NEGATIVE mg/dL   Hgb urine dipstick NEGATIVE NEGATIVE   Bilirubin Urine NEGATIVE NEGATIVE   Ketones, ur NEGATIVE NEGATIVE mg/dL   Protein, ur NEGATIVE NEGATIVE mg/dL   Nitrite NEGATIVE NEGATIVE   Leukocytes, UA NEGATIVE NEGATIVE    Comment: Performed at [place] [address], [place], [place] [address]  I-Stat CG4 Lactic Acid, ED     Status: None   Collection Time: 12/27/17 10:41 AM  Result Value Ref Range   Lactic Acid, Venous 1.20 0.5 - 1.9 mmol/L   Dg Chest 2 View  Result Date: 12/27/2017 CLINICAL DATA:  Sickle cell pain crisis. EXAM: CHEST - 2 VIEW COMPARISON:  02/09/2017 FINDINGS: Mild cardiac enlargement. No pleural effusion or edema identified. Coarse pulmonary markings are identified bilaterally. No superimposed airspace consolidation. IMPRESSION: 1. Mild cardiac enlargement. 2. No airspace opacities identified. Electronically  Signed   By: Kerby Moors M.D.   On: 12/27/2017 10:29   Ct Angio Chest Pe W/cm &/or Wo Cm  Result Date: 12/27/2017 CLINICAL DATA:  62 year old female with sickle cell pain crisis EXAM:  CT ANGIOGRAPHY CHEST WITH CONTRAST TECHNIQUE: Multidetector CT imaging of the chest was performed using the standard protocol during bolus administration of intravenous contrast. Multiplanar CT image reconstructions and MIPs were obtained to evaluate the vascular anatomy. CONTRAST:  185m ISOVUE-370 IOPAMIDOL (ISOVUE-370) INJECTION 76% COMPARISON:  Prior CT scan of the chest 02/09/2017 FINDINGS: Cardiovascular: Adequate opacification of the pulmonary arteries to the segmental level. Evaluation beyond the lobar pulmonary arteries is limited by respiratory motion artifact. No evidence of central filling defect to suggest acute pulmonary embolus. However, the main pulmonary artery is dilated at 4 cm consistent with underlying pulmonary arterial hypertension. Normal caliber thoracic aorta. No aortic dissection identified. Conventional 3 vessel arch anatomy. Cardiomegaly, similar compared to prior. No pericardial effusion. Mediastinum/Nodes: Unremarkable CT appearance of the thyroid gland. No suspicious mediastinal or hilar adenopathy. No soft tissue mediastinal mass. The thoracic esophagus is unremarkable. Lungs/Pleura: New small layering left pleural effusion. Combined paraseptal and centrilobular pulmonary emphysema. Dependent atelectasis present in the left lower lobe. No focal airspace consolidation. Moderate respiratory motion artifact. Upper Abdomen: Small calcified spleen. No acute abnormality within the visualized upper abdomen. Musculoskeletal: No acute fracture or aggressive appearing lytic or blastic osseous lesion. Review of the MIP images confirms the above findings. IMPRESSION: 1. Small left layering pleural effusion. Given the patient's clinical symptoms of left chest pain and sickle cell pain crisis, acute pleuritis  is a consideration. 2. Negative for acute pulmonary embolus. 3. Mild left lower lobe atelectasis related to the presence of the pleural effusion. No definite airspace consolidation. 4. Persistently enlarged main pulmonary artery suggesting underlying pulmonary arterial hypertension. 5.  Emphysema (ICD10-J43.9). Electronically Signed   By: HJacqulynn CadetM.D.   On: 12/27/2017 12:41    Review of Systems  Constitutional: Positive for chills and fever.  HENT: Negative.   Eyes: Negative.   Respiratory: Negative.   Cardiovascular: Positive for chest pain.  Gastrointestinal: Negative.   Genitourinary: Negative.   Musculoskeletal: Positive for myalgias.  Skin: Negative.   Neurological: Negative.   Endo/Heme/Allergies: Negative.   Psychiatric/Behavioral: Negative.     Blood pressure (!) 160/84, pulse (!) 52, temperature 98.1 F (36.7 C), temperature source Axillary, resp. rate 20, height _0  (1.702 m), weight 92.1 kg (203 lb), SpO2 98 %. Physical Exam  Constitutional: She is oriented to person, place, and time. She appears well-developed and well-nourished.  HENT:  Head: Normocephalic and atraumatic.  Eyes: Pupils are equal, round, and reactive to light. Conjunctivae are normal.  Neck: Normal range of motion. Neck supple.  Cardiovascular: Normal rate, regular rhythm and normal heart sounds.  Respiratory: Effort normal. No respiratory distress. She has rales. She exhibits no tenderness.  GI: Soft. Bowel sounds are normal.  Musculoskeletal: Normal range of motion.  Neurological: She is alert and oriented to person, place, and time.  Skin: Skin is warm and dry.  Psychiatric: She has a normal mood and affect.     Assessment/Plan 62year old female with sickle cell [place] disease admitted with left-sided pleural effusion.  #1 left-sided pleural effusion: Most likely secondary to pneumonia with parapneumonic effusion.  Other possibilities are an infarct in the area with subsequent pleural  effusion: Patient will also have had pleural effusion due to other causes including malignancy and other pleural-based reasons.  At this point patient will be admitted and be treated for the pain of pleurisy.  Thoracentesis will be done with subsequent diagnosis including Gram stain, cell count, pathology and microbiology.  #2 sickle cell disease with pain: Patient is inching  towards crisis.  She will be placed on Dilaudid PCA so she can control her pain.  We will put her on  custom dose.  Also add Toradol to help with anti-inflammation.  #3 history of emphysema: Not wheezing at the moment.  Empiric nebulizer.  #4 anemia of chronic disease: Hemoglobin is around 11.  Continue monitoring  #5 hypertension: Blood pressure was elevated in the ER.  Not on any medication at the moment.  She has as needed blood pressure medications at home.  Barbette Merino, MD 12/27/2017, 1:58 PM

## 2017-12-27 NOTE — ED Notes (Signed)
Patient transported to X-ray 

## 2017-12-27 NOTE — ED Triage Notes (Signed)
Patient here from with complaints of sickle cell pain crisis. Reports left sided abdominal pain radiating around to back. Reports "I move a lot and I'm unable to have a primary doctor".

## 2017-12-27 NOTE — ED Notes (Signed)
ED Provider at bedside. 

## 2017-12-27 NOTE — ED Provider Notes (Signed)
[facility]-EMERGENCY DEPT Provider Note   CSN: [id number] Arrival date & time: 12/27/17  4098     History   Chief Complaint Chief Complaint  Patient presents with  . Sickle Cell Pain Crisis    HPI Alexandra Andrade is a 62 y.o. female.  Patient c/o recent fever, 3 days ago, 103, productive cough, and pleuritic left lower chest pain. Symptoms persistent, constant, mod-severe, non radiating. Fever better in past day. No sore throat or sinus/nasal symptoms. No anterior chest pain or discomfort. No sob. Remote hx PE. Denies abd pain or vomiting. Hx sickle cell, but states does not feel like sickle cell related pain. Denies leg pain or swelling.   The history is provided by the patient and a significant other.  Sickle Cell Pain Crisis  Associated symptoms: cough   Associated symptoms: no chest pain, no fever, no headaches, no shortness of breath, no sore throat and no vomiting     Past Medical History:  Diagnosis Date  . Anemia   . Emphysema of lung (HCC)   . Glaucoma 2010  . Hypertension   . Pulmonary embolism (HCC)   . Recurrent sinus infections   . Sickle cell anemia (HCC)   . Spleen enlarged 2008    Patient Active Problem List   Diagnosis Date Noted  . Sickle cell anemia (HCC) 02/09/2017  . Chest pain on breathing 02/09/2017  . Emphysema lung (HCC) 02/09/2017  . Anemia of chronic disease 02/09/2017    Past Surgical History:  Procedure Laterality Date  . ABDOMINAL HYSTERECTOMY    . CHOLECYSTECTOMY       OB History    Gravida  1   Para  1   Term  0   Preterm  0   AB  0   Living        SAB  0   TAB  0   Ectopic  0   Multiple      Live Births               Home Medications    Prior to Admission medications   Medication Sig Start Date End Date Taking? Authorizing Provider  Albuterol Sulfate 108 (90 Base) MCG/ACT AEPB Inhale 1 puff into the lungs every 6 (six) hours as needed for wheezing. 10/27/16  Yes [provider]  folic acid (FOLVITE) 1 MG tablet Take 1-2 mg by mouth daily.   Yes [provider]  meloxicam (MOBIC) 15 MG tablet Take 15 mg by mouth daily. 11/16/17 11/16/18 Yes [provider]  Olmesartan-amLODIPine-HCTZ 40-10-25 MG TABS Take 1 tablet by mouth daily. 11/19/17  Yes [provider]  hydrochlorothiazide (HYDRODIURIL) 12.5 MG tablet Take 12.5 mg by mouth daily.    [provider]  irbesartan (AVAPRO) 150 MG tablet Take 150 mg by mouth daily.    [provider]  oxyCODONE (OXY IR/ROXICODONE) 5 MG immediate release tablet Take 1 tablet (5 mg total) by mouth every 4 (four) hours. 02/10/17   Altha Harm, MD    Family History Family History  Problem Relation Age of Onset  . Dementia Mother   . Cancer Father   . Sickle cell trait Son   . Lupus Sister     Social History Social History   Tobacco Use  . Smoking status: Former Smoker    Types: Cigarettes    Last attempt to quit: 02/09/2005    Years since quitting: 12.8  . Smokeless tobacco: Never Used  Substance  Use Topics  . Alcohol use: No  . Drug use: Yes    Types: Marijuana    Comment: uses for pain     Allergies   Patient has no known allergies.   Review of Systems Review of Systems  Constitutional: Negative for fever.  HENT: Negative for sore throat.   Eyes: Negative for redness.  Respiratory: Positive for cough. Negative for shortness of breath.   Cardiovascular: Negative for chest pain.  Gastrointestinal: Negative for abdominal pain and vomiting.  Genitourinary: Negative for dysuria and flank pain.  Musculoskeletal: Negative for neck pain and neck stiffness.  Skin: Negative for rash.  Neurological: Negative for headaches.  Hematological: Does not bruise/bleed easily.  Psychiatric/Behavioral: Negative for confusion.     Physical Exam Updated Vital Signs BP 132/86 (BP Location: Right Arm)   Pulse 68   Temp 99.6 F (37.6 C) (Oral)   Resp 16   Ht  1.702 m (5\' 7" )   Wt 92.1 kg (203 lb)   SpO2 99%   BMI 31.79 kg/m   Physical Exam  Constitutional: She appears well-developed and well-nourished.  HENT:  Mouth/Throat: Oropharynx is clear and moist.  Eyes: Conjunctivae are normal. No scleral icterus.  Neck: Neck supple. No tracheal deviation present.  Cardiovascular: Normal rate, regular rhythm, normal heart sounds and intact distal pulses. Exam reveals no gallop and no friction rub.  No murmur heard. Pulmonary/Chest: Effort normal and breath sounds normal. No respiratory distress.  Abdominal: Soft. Normal appearance and bowel sounds are normal. She exhibits no distension. There is no tenderness.  Genitourinary:  Genitourinary Comments: No cva tenderness.   Musculoskeletal: She exhibits no edema or tenderness.  Neurological: She is alert.  Skin: Skin is warm and dry. No rash noted. She is not diaphoretic.  Psychiatric: She has a normal mood and affect.  Nursing note and vitals reviewed.    ED Treatments / Results  Labs (all labs ordered are listed, but only abnormal results are displayed) Results for orders placed or performed during the hospital encounter of 12/27/17  Comprehensive metabolic panel  Result Value Ref Range   Sodium 141 135 - 145 mmol/L   Potassium 3.9 3.5 - 5.1 mmol/L   Chloride 104 98 - 111 mmol/L   CO2 28 22 - 32 mmol/L   Glucose, Bld 108 (H) 70 - 99 mg/dL   BUN 13 8 - 23 mg/dL   Creatinine, Ser 1.61 0.44 - 1.00 mg/dL   Calcium 9.8 8.9 - 09.6 mg/dL   Total Protein 8.4 (H) 6.5 - 8.1 g/dL   Albumin 4.2 3.5 - 5.0 g/dL   AST 19 15 - 41 U/L   ALT 13 0 - 44 U/L   Alkaline Phosphatase 76 38 - 126 U/L   Total Bilirubin 2.1 (H) 0.3 - 1.2 mg/dL   GFR calc non Af Amer >60 >60 mL/min   GFR calc Af Amer >60 >60 mL/min   Anion gap 9 5 - 15  CBC with Differential  Result Value Ref Range   WBC 9.4 4.0 - 10.5 K/uL   RBC 4.05 3.87 - 5.11 MIL/uL   Hemoglobin 11.6 (L) 12.0 - 15.0 g/dL   HCT 04.5 (L) 40.9 - 81.1 %     MCV 81.5 78.0 - 100.0 fL   MCH 28.6 26.0 - 34.0 pg   MCHC 35.2 30.0 - 36.0 g/dL   RDW 91.4 (H) 78.2 - 95.6 %   Platelets 478 (H) 150 - 400 K/uL   Neutrophils Relative % 73 %  Neutro Abs 6.9 1.7 - 7.7 K/uL   Lymphocytes Relative 15 %   Lymphs Abs 1.4 0.7 - 4.0 K/uL   Monocytes Relative 11 %   Monocytes Absolute 1.0 0.1 - 1.0 K/uL   Eosinophils Relative 1 %   Eosinophils Absolute 0.1 0.0 - 0.7 K/uL   Basophils Relative 0 %   Basophils Absolute 0.0 0.0 - 0.1 K/uL  Reticulocytes  Result Value Ref Range   Retic Ct Pct 4.9 (H) 0.4 - 3.1 %   RBC. 4.05 3.87 - 5.11 MIL/uL   Retic Count, Absolute 198.5 (H) 19.0 - 186.0 K/uL  Urinalysis, Routine w reflex microscopic  Result Value Ref Range   Color, Urine YELLOW YELLOW   APPearance CLEAR CLEAR   Specific Gravity, Urine 1.015 1.005 - 1.030   pH 7.0 5.0 - 8.0   Glucose, UA NEGATIVE NEGATIVE mg/dL   Hgb urine dipstick NEGATIVE NEGATIVE   Bilirubin Urine NEGATIVE NEGATIVE   Ketones, ur NEGATIVE NEGATIVE mg/dL   Protein, ur NEGATIVE NEGATIVE mg/dL   Nitrite NEGATIVE NEGATIVE   Leukocytes, UA NEGATIVE NEGATIVE  I-Stat CG4 Lactic Acid, ED  Result Value Ref Range   Lactic Acid, Venous 1.20 0.5 - 1.9 mmol/L   Dg Chest 2 View  Result Date: 12/27/2017 CLINICAL DATA:  Sickle cell pain crisis. EXAM: CHEST - 2 VIEW COMPARISON:  02/09/2017 FINDINGS: Mild cardiac enlargement. No pleural effusion or edema identified. Coarse pulmonary markings are identified bilaterally. No superimposed airspace consolidation. IMPRESSION: 1. Mild cardiac enlargement. 2. No airspace opacities identified. Electronically Signed   By: Signa Kell M.D.   On: 12/27/2017 10:29   Ct Angio Chest Pe W/cm &/or Wo Cm  Result Date: 12/27/2017 CLINICAL DATA:  62 year old female with sickle cell pain crisis EXAM: CT ANGIOGRAPHY CHEST WITH CONTRAST TECHNIQUE: Multidetector CT imaging of the chest was performed using the standard protocol during bolus administration of  intravenous contrast. Multiplanar CT image reconstructions and MIPs were obtained to evaluate the vascular anatomy. CONTRAST:  ISOVUE-370 IOPAMIDOL (ISOVUE-370) INJECTION 76% COMPARISON:  Prior CT scan of the chest 02/09/2017 FINDINGS: Cardiovascular: Adequate opacification of the pulmonary arteries to the segmental level. Evaluation beyond the lobar pulmonary arteries is limited by respiratory motion artifact. No evidence of central filling defect to suggest acute pulmonary embolus. However, the main pulmonary artery is dilated at 4 cm consistent with underlying pulmonary arterial hypertension. Normal caliber thoracic aorta. No aortic dissection identified. Conventional 3 vessel arch anatomy. Cardiomegaly, similar compared to prior. No pericardial effusion. Mediastinum/Nodes: Unremarkable CT appearance of the thyroid gland. No suspicious mediastinal or hilar adenopathy. No soft tissue mediastinal mass. The thoracic esophagus is unremarkable. Lungs/Pleura: New small layering left pleural effusion. Combined paraseptal and centrilobular pulmonary emphysema. Dependent atelectasis present in the left lower lobe. No focal airspace consolidation. Moderate respiratory motion artifact. Upper Abdomen: Small calcified spleen. No acute abnormality within the visualized upper abdomen. Musculoskeletal: No acute fracture or aggressive appearing lytic or blastic osseous lesion. Review of the MIP images confirms the above findings. IMPRESSION: 1. Small left layering pleural effusion. Given the patient's clinical symptoms of left chest pain and sickle cell pain crisis, acute pleuritis is a consideration. 2. Negative for acute pulmonary embolus. 3. Mild left lower lobe atelectasis related to the presence of the pleural effusion. No definite airspace consolidation. 4. Persistently enlarged main pulmonary artery suggesting underlying pulmonary arterial hypertension. 5.  Emphysema (ICD10-J43.9). Electronically Signed   By: Malachy Moan M.D.   On: 12/27/2017 12:41    EKG  None  Radiology Dg Chest 2 View  Result Date: 12/27/2017 CLINICAL DATA:  Sickle cell pain crisis. EXAM: CHEST - 2 VIEW COMPARISON:  02/09/2017 FINDINGS: Mild cardiac enlargement. No pleural effusion or edema identified. Coarse pulmonary markings are identified bilaterally. No superimposed airspace consolidation. IMPRESSION: 1. Mild cardiac enlargement. 2. No airspace opacities identified. Electronically Signed   By: Signa Kell M.D.   On: 12/27/2017 10:29   Ct Angio Chest Pe W/cm &/or Wo Cm  Result Date: 12/27/2017 CLINICAL DATA:  62 year old female with sickle cell pain crisis EXAM: CT ANGIOGRAPHY CHEST WITH CONTRAST TECHNIQUE: Multidetector CT imaging of the chest was performed using the standard protocol during bolus administration of intravenous contrast. Multiplanar CT image reconstructions and MIPs were obtained to evaluate the vascular anatomy. CONTRAST:  ISOVUE-370 IOPAMIDOL (ISOVUE-370) INJECTION 76% COMPARISON:  Prior CT scan of the chest 02/09/2017 FINDINGS: Cardiovascular: Adequate opacification of the pulmonary arteries to the segmental level. Evaluation beyond the lobar pulmonary arteries is limited by respiratory motion artifact. No evidence of central filling defect to suggest acute pulmonary embolus. However, the main pulmonary artery is dilated at 4 cm consistent with underlying pulmonary arterial hypertension. Normal caliber thoracic aorta. No aortic dissection identified. Conventional 3 vessel arch anatomy. Cardiomegaly, similar compared to prior. No pericardial effusion. Mediastinum/Nodes: Unremarkable CT appearance of the thyroid gland. No suspicious mediastinal or hilar adenopathy. No soft tissue mediastinal mass. The thoracic esophagus is unremarkable. Lungs/Pleura: New small layering left pleural effusion. Combined paraseptal and centrilobular pulmonary emphysema. Dependent atelectasis present in the left lower lobe. No  focal airspace consolidation. Moderate respiratory motion artifact. Upper Abdomen: Small calcified spleen. No acute abnormality within the visualized upper abdomen. Musculoskeletal: No acute fracture or aggressive appearing lytic or blastic osseous lesion. Review of the MIP images confirms the above findings. IMPRESSION: 1. Small left layering pleural effusion. Given the patient's clinical symptoms of left chest pain and sickle cell pain crisis, acute pleuritis is a consideration. 2. Negative for acute pulmonary embolus. 3. Mild left lower lobe atelectasis related to the presence of the pleural effusion. No definite airspace consolidation. 4. Persistently enlarged main pulmonary artery suggesting underlying pulmonary arterial hypertension. 5.  Emphysema (ICD10-J43.9). Electronically Signed   By: Malachy Moan M.D.   On: 12/27/2017 12:41    Procedures Procedures (including critical care time)  Medications Ordered in ED Medications  sodium chloride 0.9 % bolus 500 mL (500 mLs Intravenous New Bag/Given 12/27/17 1036)  HYDROmorphone (DILAUDID) injection 1 mg (1 mg Intravenous Given 12/27/17 1035)  ondansetron (ZOFRAN) injection 4 mg (4 mg Intravenous Given 12/27/17 1035)     Initial Impression / Assessment and Plan / ED Course  I have reviewed the triage vital signs and the nursing notes.  Pertinent labs & imaging results that were available during my care of the patient were reviewed by me and considered in my medical decision making (see chart for details).  Iv ns. o2 [place]. Labs. Cxr.  Dilaudid 1 mg iv.  Reviewed nursing notes and prior charts for additional history.   Cxr reviewed - no infiltrate. Given pleuritic pain, hx PE, will get cta r/o PE.  Ct reviewed - no pe. Effusion on left, corresponding to symptoms. Given recent fever, will tx w abx.   Pain persists. Dilaudid 1 mg iv.   Sickle cell service consulted - discussed with Dr Mikeal Hawthorne, he will admit.     Final Clinical  Impressions(s) / ED Diagnoses   Final diagnoses:  None    ED Discharge Orders  None       Cathren LaineSteinl, Lovel Suazo, MD 12/27/17 1345

## 2017-12-27 NOTE — Plan of Care (Signed)
Plan of care discussed with patient and husband 

## 2017-12-27 NOTE — ED Notes (Signed)
ED TO INPATIENT HANDOFF REPORT  Name/Age/Gender Alexandra Andrade 62 y.o. female  Code Status Code Status History    Date Active Date Inactive Code Status Order ID Comments User Context   02/09/2017 1724 02/10/2017 2146 Full Code [id number]  Leana Gamer, MD Inpatient   02/09/2017 1724 02/09/2017 1724 Full Code [id number]  Leana Gamer, MD Inpatient      Home/SNF/Other Home  Chief Complaint abd pain                                                             Level of Care/Admitting Diagnosis ED Disposition    ED Disposition Condition [place]: [facility] [[id number]]  Level of Care: Med-Surg [16]  Diagnosis: Pleural effusion [417408]  Admitting Physician: Elwyn Reach [[id number]]  Attending Physician: Elwyn Reach [[id number]]  Estimated length of stay: past midnight tomorrow  Certification:: I certify this patient will need inpatient services for at least 2 midnights  PT Class (Do Not Modify): Inpatient [101]  PT Acc Code (Do Not Modify): Private [1]       Medical History Past Medical History:  Diagnosis Date  . Anemia   . Emphysema of lung ([place])   . Glaucoma 2010  . Hypertension   . Pulmonary embolism ([place])   . Recurrent sinus infections   . Sickle cell anemia (HCC)   . Spleen enlarged 2008    Allergies No Known Allergies  IV Location/Drains/Wounds Patient Lines/Drains/Airways Status   Active Line/Drains/Airways    Name:   Placement date:   Placement time:   Site:   Days:   Peripheral IV 12/27/17 Right Forearm   12/27/17    1034    Forearm   less than 1          Labs/Imaging Results for orders placed or performed during the hospital encounter of 12/27/17 (from the past 48 hour(s))  Comprehensive metabolic panel     Status: Abnormal   Collection Time: 12/27/17 10:22 AM  Result Value Ref Range   Sodium 141 135 - 145 mmol/L   Potassium 3.9 3.5 - 5.1 mmol/L   Chloride 104 98 - 111 mmol/L    Comment:  Please note change in reference range.   CO2 28 22 - 32 mmol/L   Glucose, Bld 108 (H) 70 - 99 mg/dL    Comment: Please note change in reference range.   BUN 13 8 - 23 mg/dL    Comment: Please note change in reference range.   Creatinine, Ser 0.84 0.44 - 1.00 mg/dL   Calcium 9.8 8.9 - 10.3 mg/dL   Total Protein 8.4 (H) 6.5 - 8.1 g/dL   Albumin 4.2 3.5 - 5.0 g/dL   AST 19 15 - 41 U/L   ALT 13 0 - 44 U/L    Comment: Please note change in reference range.   Alkaline Phosphatase 76 38 - 126 U/L   Total Bilirubin 2.1 (H) 0.3 - 1.2 mg/dL   GFR calc non Af Amer >60 >60 mL/min   GFR calc Af Amer >60 >60 mL/min    Comment: (NOTE) The eGFR has been calculated using the CKD EPI equation. This calculation has not been validated in all clinical situations. eGFR's persistently <60 mL/min signify possible Chronic Kidney  Disease.    Anion gap 9 5 - 15    Comment: Performed at [facility], [place] [address], [place], [place] [address]  CBC with Differential     Status: Abnormal   Collection Time: 12/27/17 10:22 AM  Result Value Ref Range   WBC 9.4 4.0 - 10.5 K/uL   RBC 4.05 3.87 - 5.11 MIL/uL   Hemoglobin 11.6 (L) 12.0 - 15.0 g/dL   HCT 33.0 (L) 36.0 - 46.0 %   MCV 81.5 78.0 - 100.0 fL   MCH 28.6 26.0 - 34.0 pg   MCHC 35.2 30.0 - 36.0 g/dL   RDW 15.7 (H) 11.5 - 15.5 %   Platelets 478 (H) 150 - 400 K/uL   Neutrophils Relative % 73 %   Neutro Abs 6.9 1.7 - 7.7 K/uL   Lymphocytes Relative 15 %   Lymphs Abs 1.4 0.7 - 4.0 K/uL   Monocytes Relative 11 %   Monocytes Absolute 1.0 0.1 - 1.0 K/uL   Eosinophils Relative 1 %   Eosinophils Absolute 0.1 0.0 - 0.7 K/uL   Basophils Relative 0 %   Basophils Absolute 0.0 0.0 - 0.1 K/uL    Comment: Performed at [facility] [place] [address], [place], [place] [address]  Reticulocytes     Status: Abnormal   Collection Time: 12/27/17 10:22 AM  Result Value Ref Range   Retic Ct Pct 4.9 (H) 0.4 - 3.1 %   RBC. 4.05 3.87 -  5.11 MIL/uL   Retic Count, Absolute 198.5 (H) 19.0 - 186.0 K/uL    Comment: Performed at [facility], [place] [address] [place], [place] [address]  Urinalysis, Routine w reflex microscopic     Status: None   Collection Time: 12/27/17 10:23 AM  Result Value Ref Range   Color, Urine YELLOW YELLOW   APPearance CLEAR CLEAR   Specific Gravity, Urine 1.015 1.005 - 1.030   pH 7.0 5.0 - 8.0   Glucose, UA NEGATIVE NEGATIVE mg/dL   Hgb urine dipstick NEGATIVE NEGATIVE   Bilirubin Urine NEGATIVE NEGATIVE   Ketones, ur NEGATIVE NEGATIVE mg/dL   Protein, ur NEGATIVE NEGATIVE mg/dL   Nitrite NEGATIVE NEGATIVE   Leukocytes, UA NEGATIVE NEGATIVE    Comment: Performed at [place] [address] [place],  [address]  I-Stat CG4 Lactic Acid, ED     Status: None   Collection Time: 12/27/17 10:41 AM  Result Value Ref Range   Lactic Acid, Venous 1.20 0.5 - 1.9 mmol/L   Dg Chest 2 View  Result Date: 12/27/2017 CLINICAL DATA:  Sickle cell pain crisis. EXAM: CHEST - 2 VIEW COMPARISON:  02/09/2017 FINDINGS: Mild cardiac enlargement. No pleural effusion or edema identified. Coarse pulmonary markings are identified bilaterally. No superimposed airspace consolidation. IMPRESSION: 1. Mild cardiac enlargement. 2. No airspace opacities identified. Electronically Signed   By: Kerby Moors M.D.   On: 12/27/2017 10:29   Ct Angio Chest Pe W/cm &/or Wo Cm  Result Date: 12/27/2017 CLINICAL DATA:  62 year old female with sickle cell pain crisis EXAM: CT ANGIOGRAPHY CHEST WITH CONTRAST TECHNIQUE: Multidetector CT imaging of the chest was performed using the standard protocol during bolus administration of intravenous contrast. Multiplanar CT image reconstructions and MIPs were obtained to evaluate the vascular anatomy. CONTRAST:  140m ISOVUE-370 IOPAMIDOL (ISOVUE-370) INJECTION 76% COMPARISON:  Prior CT scan of the chest 02/09/2017 FINDINGS: Cardiovascular: Adequate  opacification of the pulmonary arteries to the segmental level. Evaluation beyond the lobar pulmonary arteries  is limited by respiratory motion artifact. No evidence of central filling defect to suggest acute pulmonary embolus. However, the main pulmonary artery is dilated at 4 cm consistent with underlying pulmonary arterial hypertension. Normal caliber thoracic aorta. No aortic dissection identified. Conventional 3 vessel arch anatomy. Cardiomegaly, similar compared to prior. No pericardial effusion. Mediastinum/Nodes: Unremarkable CT appearance of the thyroid gland. No suspicious mediastinal or hilar adenopathy. No soft tissue mediastinal mass. The thoracic esophagus is unremarkable. Lungs/Pleura: New small layering left pleural effusion. Combined paraseptal and centrilobular pulmonary emphysema. Dependent atelectasis present in the left lower lobe. No focal airspace consolidation. Moderate respiratory motion artifact. Upper Abdomen: Small calcified spleen. No acute abnormality within the visualized upper abdomen. Musculoskeletal: No acute fracture or aggressive appearing lytic or blastic osseous lesion. Review of the MIP images confirms the above findings. IMPRESSION: 1. Small left layering pleural effusion. Given the patient's clinical symptoms of left chest pain and sickle cell pain crisis, acute pleuritis is a consideration. 2. Negative for acute pulmonary embolus. 3. Mild left lower lobe atelectasis related to the presence of the pleural effusion. No definite airspace consolidation. 4. Persistently enlarged main pulmonary artery suggesting underlying pulmonary arterial hypertension. 5.  Emphysema (ICD10-J43.9). Electronically Signed   By: Jacqulynn Cadet M.D.   On: 12/27/2017 12:41    Pending Labs [employer] (From admission, onward)   Start     Ordered   Signed and Held  CBC  (enoxaparin (LOVENOX)    CrCl >/= 30 ml/min)  Once,   R    Comments:  Baseline for enoxaparin therapy IF NOT ALREADY  DRAWN.  Notify MD if PLT < 100 K.    Signed and Held   Signed and Held  Creatinine, serum  (enoxaparin (LOVENOX)    CrCl >/= 30 ml/min)  Once,   R    Comments:  Baseline for enoxaparin therapy IF NOT ALREADY DRAWN.    Signed and Held   Signed and Held  Creatinine, serum  (enoxaparin (LOVENOX)    CrCl >/= 30 ml/min)  Weekly,   R    Comments:  while on enoxaparin therapy    Signed and Held   Signed and Held  Comprehensive metabolic panel  Tomorrow morning,   R     Signed and Held   Signed and Held  CBC with Differential/Platelet  Tomorrow morning,   R     Signed and Held      Vitals/Pain Today's Vitals   12/27/17 1415 12/27/17 1430 12/27/17 1500 12/27/17 1530  BP: 118/72 (!) 81/52  100/71  Pulse: 79 75  65  Resp: _0 Temp:  98 F (36.7 C)    TempSrc:  Axillary    SpO2: 94% 98% 96% 93%  Weight:      Height:      PainSc:        Isolation Precautions No active isolations  Medications Medications  iopamidol (ISOVUE-370) 76 % injection (has no administration in time range)  sodium chloride 0.9 % bolus 500 mL (0 mLs Intravenous Stopped 12/27/17 1608)  HYDROmorphone (DILAUDID) injection 1 mg (1 mg Intravenous Given 12/27/17 1035)  ondansetron (ZOFRAN) injection 4 mg (4 mg Intravenous Given 12/27/17 1035)  iopamidol (ISOVUE-370) 76 % injection 100 mL (100 mLs Intravenous Contrast Given 12/27/17 1207)  HYDROmorphone (DILAUDID) injection 1 mg (1 mg Intravenous Given 12/27/17 1302)  cefTRIAXone (ROCEPHIN) 1 g in sodium chloride 0.9 % 100 mL IVPB (0 g Intravenous Stopped 12/27/17 1331)  azithromycin (ZITHROMAX) 500 mg  in sodium chloride 0.9 % 250 mL IVPB (0 mg Intravenous Stopped 12/27/17 1609)    Mobility walks with device

## 2017-12-28 ENCOUNTER — Inpatient Hospital Stay (HOSPITAL_COMMUNITY): Payer: BLUE CROSS/BLUE SHIELD

## 2017-12-28 DIAGNOSIS — D5700 Hb-SS disease with crisis, unspecified: Secondary | ICD-10-CM

## 2017-12-28 DIAGNOSIS — J90 Pleural effusion, not elsewhere classified: Secondary | ICD-10-CM

## 2017-12-28 DIAGNOSIS — D638 Anemia in other chronic diseases classified elsewhere: Secondary | ICD-10-CM

## 2017-12-28 DIAGNOSIS — R071 Chest pain on breathing: Secondary | ICD-10-CM

## 2017-12-28 DIAGNOSIS — J439 Emphysema, unspecified: Secondary | ICD-10-CM

## 2017-12-28 LAB — COMPREHENSIVE METABOLIC PANEL
ALBUMIN: 3.1 g/dL — AB (ref 3.5–5.0)
ALT: 10 U/L (ref 0–44)
ANION GAP: 6 (ref 5–15)
AST: 14 U/L — AB (ref 15–41)
Alkaline Phosphatase: 52 U/L (ref 38–126)
BUN: 12 mg/dL (ref 8–23)
CHLORIDE: 105 mmol/L (ref 98–111)
CO2: 28 mmol/L (ref 22–32)
Calcium: 8.6 mg/dL — ABNORMAL LOW (ref 8.9–10.3)
Creatinine, Ser: 0.93 mg/dL (ref 0.44–1.00)
GFR calc Af Amer: >60 mL/min (ref >60)
Glucose, Bld: 111 mg/dL — ABNORMAL HIGH (ref 70–99)
POTASSIUM: 3.9 mmol/L (ref 3.5–5.1)
Sodium: 139 mmol/L (ref 135–145)
Total Bilirubin: 0.9 mg/dL (ref 0.3–1.2)
Total Protein: 6.3 g/dL — ABNORMAL LOW (ref 6.5–8.1)

## 2017-12-28 LAB — CBC WITH DIFFERENTIAL/PLATELET
Basophils Absolute: 0 10*3/uL (ref 0.0–0.1)
Basophils Relative: 1 %
EOS PCT: 4 %
Eosinophils Absolute: 0.3 10*3/uL (ref 0.0–0.7)
HEMATOCRIT: 26.1 % — AB (ref 36.0–46.0)
HEMOGLOBIN: 9 g/dL — AB (ref 12.0–15.0)
LYMPHS ABS: 2.3 10*3/uL (ref 0.7–4.0)
LYMPHS PCT: 34 %
MCH: 28 pg (ref 26.0–34.0)
MCHC: 34.5 g/dL (ref 30.0–36.0)
MCV: 81.3 fL (ref 78.0–100.0)
Monocytes Absolute: 1.1 10*3/uL — ABNORMAL HIGH (ref 0.1–1.0)
Monocytes Relative: 16 %
NEUTROS ABS: 3.1 10*3/uL (ref 1.7–7.7)
Neutrophils Relative %: 45 %
PLATELETS: 435 10*3/uL — AB (ref 150–400)
RBC: 3.21 MIL/uL — ABNORMAL LOW (ref 3.87–5.11)
RDW: 15.7 % — ABNORMAL HIGH (ref 11.5–15.5)
WBC: 6.9 10*3/uL (ref 4.0–10.5)

## 2017-12-28 MED ORDER — HYDROMORPHONE HCL 1 MG/ML IJ SOLN
0.5000 mg | INTRAMUSCULAR | Status: DC | PRN
  Administered 2017-12-28 (×2): 0.5 mg via INTRAVENOUS
  Filled 2017-12-28 (×2): qty 0.5

## 2017-12-28 MED ORDER — DIPHENHYDRAMINE HCL 25 MG PO CAPS
25.0000 mg | ORAL_CAPSULE | Freq: Four times a day (QID) | ORAL | Status: DC | PRN
  Administered 2017-12-28 (×2): 25 mg via ORAL
  Filled 2017-12-28 (×2): qty 1

## 2017-12-28 MED ORDER — OXYCODONE HCL 5 MG PO TABS: 5.0000 mg | ORAL_TABLET | Freq: Four times a day (QID) | ORAL | Status: DC | PRN

## 2017-12-28 NOTE — Progress Notes (Addendum)
Subjective: Alexandra Andrade, a 62 year old female with a history of sickle cell anemia, hemoglobin [place], hypertension, and history of pulmonary embolism was admitted on 12/27/2017 with pain to left upper quadrant and left lung.  Patient currently rates pain intensity as 7/10.  She states the pain is bearable at rest but typically worsens with activity.  She is also endorses pain with inspiration and shortness of breath.  Patient is opiate nave and does not like taking pain medications.  Patient has not used Dilaudid PCA overnight.  She also has not had PRN pain medications for pain control.  She states "it makes my skin crawl".  Patient had a full work-up with concern of possible splenic infarct or sequestration based on localization of symptoms.  CTA was also done specifically to rule out pulmonary embolism since patient has a prior history.  Patient had mild pleural effusion and a thoracentesis was ordered.  Patient was unable to undergo thoracentesis due to a very small amount of fluid identified on the left which was not amenable to thoracentesis at that time.  Patient appears uncomfortable and her husband is at bedside.  She continues to remain afebrile.  She denies chills, headache, heart palpitations, dysuria, nausea, vomiting, diarrhea, or constipation.  Objective:  Vital signs in last 24 hours:  Vitals:   12/28/17 0003 12/28/17 0500 12/28/17 0503 12/28/17 0742  BP: 136/74  106/68   Pulse: (!) 45  (!) 44   Resp: 20 15 15 18   Temp: (!) 97.5 F (36.4 C)  97.7 F (36.5 C)   TempSrc: Oral  Oral   SpO2: 99% 100% 100% 100%  Weight:      Height:        Intake/Output from previous day:   Intake/Output Summary (Last 24 hours) at 12/28/2017 1211 Last data filed at 12/28/2017 1017 Gross per 24 hour  Intake 2470 ml  Output -  Net 2470 ml    Physical Exam: General: Alert, awake, oriented x3, in no acute distress.  HEENT: [place]/AT PEERL, EOMI Neck: Trachea midline,  no masses, no  thyromegal,y no JVD, no carotid bruit OROPHARYNX:  Moist, No exudate/ erythema/lesions.  Heart: Regular rate and rhythm, without murmurs, rubs, gallops, PMI non-displaced, no heaves or thrills on palpation.  Lungs: Clear to auscultation, no wheezing or rhonchi noted. No increased vocal fremitus resonant to percussion  Abdomen: Soft, nontender, nondistended, positive bowel sounds, no masses no hepatosplenomegaly noted..  Neuro: No focal neurological deficits noted cranial nerves II through XII grossly intact. DTRs 2+ bilaterally upper and lower extremities. Strength 5 out of 5 in bilateral upper and lower extremities. Musculoskeletal: No warm swelling or erythema around joints, no spinal tenderness noted. Psychiatric: Patient alert and oriented x3, good insight and cognition, good recent to remote recall. Lymph node survey: No cervical axillary or inguinal lymphadenopathy noted.  Lab Results:  Basic Metabolic Panel:    Component Value Date/Time   NA 139 12/28/2017 0502   K 3.9 12/28/2017 0502   CL 105 12/28/2017 0502   CO2 28 12/28/2017 0502   BUN 12 12/28/2017 0502   CREATININE 0.93 12/28/2017 0502   GLUCOSE 111 (H) 12/28/2017 0502   CALCIUM 8.6 (L) 12/28/2017 0502   CBC:    Component Value Date/Time   WBC 6.9 12/28/2017 0502   HGB 9.0 (L) 12/28/2017 0502   HCT 26.1 (L) 12/28/2017 0502   PLT 435 (H) 12/28/2017 0502   MCV 81.3 12/28/2017 0502   NEUTROABS 3.1 12/28/2017 0502   LYMPHSABS 2.3 12/28/2017  0502   MONOABS 1.1 (H) 12/28/2017 0502   EOSABS 0.3 12/28/2017 0502   BASOSABS 0.0 12/28/2017 0502    No results found for this or any previous visit (from the past 240 hour(s)).  Studies/Results: Dg Chest 2 View  Result Date: 12/27/2017 CLINICAL DATA:  Sickle cell pain crisis. EXAM: CHEST - 2 VIEW COMPARISON:  02/09/2017 FINDINGS: Mild cardiac enlargement. No pleural effusion or edema identified. Coarse pulmonary markings are identified bilaterally. No superimposed airspace  consolidation. IMPRESSION: 1. Mild cardiac enlargement. 2. No airspace opacities identified. Electronically Signed   By: Signa Kellaylor  Stroud M.D.   On: 12/27/2017 10:29   Ct Angio Chest Pe W/cm &/or Wo Cm  Result Date: 12/27/2017 CLINICAL DATA:  62 year old female with sickle cell pain crisis EXAM: CT ANGIOGRAPHY CHEST WITH CONTRAST TECHNIQUE: Multidetector CT imaging of the chest was performed using the standard protocol during bolus administration of intravenous contrast. Multiplanar CT image reconstructions and MIPs were obtained to evaluate the vascular anatomy. CONTRAST:  100mL ISOVUE-370 IOPAMIDOL (ISOVUE-370) INJECTION 76% COMPARISON:  Prior CT scan of the chest 02/09/2017 FINDINGS: Cardiovascular: Adequate opacification of the pulmonary arteries to the segmental level. Evaluation beyond the lobar pulmonary arteries is limited by respiratory motion artifact. No evidence of central filling defect to suggest acute pulmonary embolus. However, the main pulmonary artery is dilated at 4 cm consistent with underlying pulmonary arterial hypertension. Normal caliber thoracic aorta. No aortic dissection identified. Conventional 3 vessel arch anatomy. Cardiomegaly, similar compared to prior. No pericardial effusion. Mediastinum/Nodes: Unremarkable CT appearance of the thyroid gland. No suspicious mediastinal or hilar adenopathy. No soft tissue mediastinal mass. The thoracic esophagus is unremarkable. Lungs/Pleura: New small layering left pleural effusion. Combined paraseptal and centrilobular pulmonary emphysema. Dependent atelectasis present in the left lower lobe. No focal airspace consolidation. Moderate respiratory motion artifact. Upper Abdomen: Small calcified spleen. No acute abnormality within the visualized upper abdomen. Musculoskeletal: No acute fracture or aggressive appearing lytic or blastic osseous lesion. Review of the MIP images confirms the above findings. IMPRESSION: 1. Small left layering pleural  effusion. Given the patient's clinical symptoms of left chest pain and sickle cell pain crisis, acute pleuritis is a consideration. 2. Negative for acute pulmonary embolus. 3. Mild left lower lobe atelectasis related to the presence of the pleural effusion. No definite airspace consolidation. 4. Persistently enlarged main pulmonary artery suggesting underlying pulmonary arterial hypertension. 5.  Emphysema (ICD10-J43.9). Electronically Signed   By: Malachy MoanHeath  McCullough M.D.   On: 12/27/2017 12:41   [place] Chest (pleural Effusion)  Result Date: 12/28/2017 CLINICAL DATA:  Small left pleural effusion noted on CT. Left-sided pain. EXAM: CHEST ULTRASOUND COMPARISON:  CT 12/27/2017 FINDINGS: Survey ultrasound of the left hemithorax was performed. Only a trace left pleural effusion was identified. No significant pocket to allow safe percutaneous thoracentesis. IMPRESSION: Trace residual left pleural effusion. Due to small size, thoracentesis deferred. Electronically Signed   By: Corlis Leak  Hassell M.D.   On: 12/28/2017 10:37    Medications: Scheduled Meds: . olmesartan  40 mg Oral Daily   And  . amLODipine  10 mg Oral Daily   And  . hydrochlorothiazide  25 mg Oral Daily  . enoxaparin (LOVENOX) injection  40 mg Subcutaneous Q24H  . folic acid  1-2 mg Oral Daily  . ketorolac  30 mg Intravenous Q6H  . senna-docusate  1 tablet Oral BID   Continuous Infusions: . azithromycin 500 mg (12/28/17 1129)  . cefTRIAXone (ROCEPHIN)  IV Stopped (12/28/17 1101)  . dextrose 5 % and  0.45% NaCl 100 mL/hr at 12/28/17 0522   PRN Meds:.albuterol, diphenhydrAMINE, HYDROmorphone (DILAUDID) injection, polyethylene glycol  Assessment/Plan: Principal Problem:   Pleural effusion on left Active Problems:   Chest pain on breathing   Emphysema lung (HCC)   Anemia of chronic disease   Sickle cell anemia with pain (HCC)   Pleural effusion  Left-sided pleural effusion Will discontinue antibiotics, WBC count within normal limits.  Most  likely secondary to infarct in the area with subsequent pleural effusion. Thoracentesis unable to be performed due to small amount.   Sickle cell crisis D5 0.45 at 75 mL/h Patient did not maximize Dilaudid PCA overnight, will discontinue. Toradol 15 mg every 6 hours as needed for 5 days   Will order Dilaudid 0.5 mg every 4 hours as needed per clinician assisted IV doses. Benadryl 25 mg every 6 hours as needed for itching related to opiate medication Oxycodone 5 mg every 6 hours as needed for moderate to severe pain related to sickle cell crisis Oxygen 2 L  Sickle cell anemia Folic acid 1 mg daily   History of emphysema Patient asymptomatic, albuterol every 6 hours as needed for wheezing, shortness of breath, or persistent coughing  Hypertension Will continue olmesartan-amlodipine-HCTZ 40-10-20 5 mg daily  Anemia of chronic disease Hemoglobin has decreased to 9, will continue to monitor   Code Status: Full Code Family Communication: N/A Disposition Plan: Not yet ready for discharge  Nolon Nations  MSN, FNP-C [facility] [address] [place], [place] [address] [phone]  If 5PM-8AM, please contact night-coverage.  12/28/2017, 12:11 PM  LOS: 1 day

## 2017-12-28 NOTE — Progress Notes (Signed)
Patient brought to radiology for possible thoracentesis.  A very small amount of fluid identified on left which is not amenable to thoracentesis at this time.  US Chest dictated separately. Imaging available for review.   Loyce DysKacie Matthews, MS RD PA-C 11:46 AM

## 2017-12-29 LAB — CBC WITH DIFFERENTIAL/PLATELET
BASOS ABS: 0 10*3/uL (ref 0.0–0.1)
Basophils Relative: 0 %
EOS PCT: 5 %
Eosinophils Absolute: 0.4 10*3/uL (ref 0.0–0.7)
HEMATOCRIT: 27.5 % — AB (ref 36.0–46.0)
Hemoglobin: 9.5 g/dL — ABNORMAL LOW (ref 12.0–15.0)
LYMPHS PCT: 33 %
Lymphs Abs: 2.3 10*3/uL (ref 0.7–4.0)
MCH: 28 pg (ref 26.0–34.0)
MCHC: 34.5 g/dL (ref 30.0–36.0)
MCV: 81.1 fL (ref 78.0–100.0)
Monocytes Absolute: 0.7 10*3/uL (ref 0.1–1.0)
Monocytes Relative: 10 %
NEUTROS ABS: 3.6 10*3/uL (ref 1.7–7.7)
Neutrophils Relative %: 52 %
PLATELETS: 482 10*3/uL — AB (ref 150–400)
RBC: 3.39 MIL/uL — AB (ref 3.87–5.11)
RDW: 15.6 % — ABNORMAL HIGH (ref 11.5–15.5)
WBC: 7 10*3/uL (ref 4.0–10.5)

## 2017-12-29 LAB — COMPREHENSIVE METABOLIC PANEL
ALBUMIN: 3.2 g/dL — AB (ref 3.5–5.0)
ALT: 11 U/L (ref 0–44)
AST: 16 U/L (ref 15–41)
Alkaline Phosphatase: 58 U/L (ref 38–126)
Anion gap: 7 (ref 5–15)
BUN: 8 mg/dL (ref 8–23)
CHLORIDE: 106 mmol/L (ref 98–111)
CO2: 28 mmol/L (ref 22–32)
CREATININE: 0.91 mg/dL (ref 0.44–1.00)
Calcium: 8.9 mg/dL (ref 8.9–10.3)
GFR calc Af Amer: >60 mL/min (ref >60)
Glucose, Bld: 105 mg/dL — ABNORMAL HIGH (ref 70–99)
Potassium: 3.9 mmol/L (ref 3.5–5.1)
Sodium: 141 mmol/L (ref 135–145)
Total Bilirubin: 1.4 mg/dL — ABNORMAL HIGH (ref 0.3–1.2)
Total Protein: 6.6 g/dL (ref 6.5–8.1)

## 2017-12-29 LAB — LACTATE DEHYDROGENASE: LDH: 169 U/L (ref 98–192)

## 2017-12-29 MED ORDER — MELOXICAM 7.5 MG PO TABS: 7.5000 mg | ORAL_TABLET | Freq: Two times a day (BID) | ORAL | 11 refills | Status: AC

## 2017-12-29 NOTE — Progress Notes (Signed)
Patient discharged home. All instructions given, patient and husband verbalized understanding. IV removed. Patient left in stable condition

## 2017-12-29 NOTE — Discharge Summary (Signed)
Physician Discharge Summary  Mardene CelesteSandra Hunt-Lennox ZOX:[id number] DOB: 1955/11/09 DOA: 12/27/2017  PCP: Eartha InchBadger, Michael C, MD  Admit date: 12/27/2017  Discharge date: 12/29/2017  Discharge Diagnoses:  Principal Problem:   Pleural effusion on left Active Problems:   Chest pain on breathing   Emphysema lung (HCC)   Anemia of chronic disease   Sickle cell anemia with pain (HCC)   Pleural effusion   Discharge Condition: Stable  Disposition:  Sickle cell anemia: Pt is discharged home in good condition and is to follow up with Eartha InchBadger, Michael C, MD in 1 week to have labs evaluated. Repeat CBC with differential and BMP.  A referral has also been sent to hematology for evaluation of sickle cell anemia. Dois DavenportSandra Hunt-Lennox is instructed to increase activity slowly and balance with rest for the next few days, and use prescribed medication to complete treatment of pain.   Diet: Regular Wt Readings from Last 3 Encounters:  12/27/17 203 lb (92.1 kg)  02/09/17 200 lb (90.7 kg)    History of present illness:  Dois DavenportSandra Hunt-Lennox, a 62 year old female with a history of sickle cell anemia, HbSC, hypertension, history of emphysema, and history of pulmonary embolism presented to emergency department with left upper quadrant and fever of 103 three days prior. Patient is opiate naive and does not like taking opiate medications. In the ER, pain was rated as 10/10, non radiating.  and did not improve following IV dilaudid. Patient underwent work-up with suspicion of splenic infarct or sequestration based on localization of pain. Patient was found to have mild pleural effusion to left side corresponding with pain. Patient admitted to inpatient services.   Hospital Course:  Patient was admitted for sickle cell pain crisis and pleuritis of left lung.  Her pain of sickle cell anemia was managed appropriately with IVF, IV Toradol, Oxycodone, and other adjunct therapies. At the time of discharge patient was not having  pain. Patient was also ambulating in halls without assistance prior to discharge. Patient will continue Tramadol 50 mg every 6 hours as needed for moderate to severe pain.   Patient has anemia of chronic disease. Hemoglobin this am was 9.5, which is consistent with baseline.   Will continue Olmesartan-amlodipine-HCTZ 40-10-25 mg daily for hypertension.   Pain resolved on Tramadol 15 mg every 6 hours. Will continue Meloxicam 7.5 mg twice daily.   Patient was discharged home today in a hemodynamically stable condition.   Discharge Exam: Vitals:   12/29/17 0531 12/29/17 1023  BP: 133/79 135/75  Pulse: (!) 57 (!) 54  Resp: 17   Temp:    SpO2: 98%    Vitals:   12/28/17 2038 12/29/17 0105 12/29/17 0531 12/29/17 1023  BP: 102/70 115/72 133/79 135/75  Pulse:  60 (!) 57 (!) 54  Resp:   17   Temp:  98.9 F (37.2 C)    TempSrc:  Oral Oral   SpO2:  92% 98%   Weight:      Height:        General appearance : Awake, alert, not in any distress. Speech Clear. Not toxic looking HEENT: Atraumatic and Normocephalic, pupils equally reactive to light and accomodation Neck: Supple, no JVD. No cervical lymphadenopathy.  Chest: Good air entry bilaterally, no added sounds  CVS: S1 S2 regular, no murmurs.  Abdomen: Bowel sounds present, Non tender and not distended with no gaurding, rigidity or rebound. Extremities: B/L Lower Ext shows no edema, both legs are warm to touch Neurology: Awake alert, and oriented X  3, CN II-XII intact, Non focal Skin: No Rash  Discharge Instructions  Discharge Instructions    Ambulatory referral to Hematology   Complete by:  As directed    [facility]: Dr. Oretha Milch   Discharge patient   Complete by:  As directed    Discharge disposition:  01-Home or Self Care   Discharge patient date:  12/29/2017     Allergies as of 12/29/2017   No Known Allergies     Medication List    STOP taking these medications   oxyCODONE 5 MG immediate release  tablet Commonly known as:  Oxy IR/ROXICODONE     TAKE these medications   Albuterol Sulfate 108 (90 Base) MCG/ACT Aepb Inhale 1 puff into the lungs every 6 (six) hours as needed for wheezing.   folic acid 1 MG tablet Commonly known as:  FOLVITE Take 1-2 mg by mouth daily.   meloxicam 7.5 MG tablet Commonly known as:  MOBIC Take 1 tablet (7.5 mg total) by mouth 2 (two) times daily. What changed:    medication strength  how much to take  when to take this   Olmesartan-amLODIPine-HCTZ 40-10-25 MG Tabs Take 1 tablet by mouth daily.   traMADol 50 MG tablet Commonly known as:  ULTRAM Take 50 mg by mouth every 6 (six) hours as needed for moderate pain.       The results of significant diagnostics from this hospitalization (including imaging, microbiology, ancillary and laboratory) are listed below for reference.    Significant Diagnostic Studies: Dg Chest 2 View  Result Date: 12/27/2017 CLINICAL DATA:  Sickle cell pain crisis. EXAM: CHEST - 2 VIEW COMPARISON:  02/09/2017 FINDINGS: Mild cardiac enlargement. No pleural effusion or edema identified. Coarse pulmonary markings are identified bilaterally. No superimposed airspace consolidation. IMPRESSION: 1. Mild cardiac enlargement. 2. No airspace opacities identified. Electronically Signed   By: Signa Kell M.D.   On: 12/27/2017 10:29   Ct Angio Chest Pe W/cm &/or Wo Cm  Result Date: 12/27/2017 CLINICAL DATA:  62 year old female with sickle cell pain crisis EXAM: CT ANGIOGRAPHY CHEST WITH CONTRAST TECHNIQUE: Multidetector CT imaging of the chest was performed using the standard protocol during bolus administration of intravenous contrast. Multiplanar CT image reconstructions and MIPs were obtained to evaluate the vascular anatomy. CONTRAST:  ISOVUE-370 IOPAMIDOL (ISOVUE-370) INJECTION 76% COMPARISON:  Prior CT scan of the chest 02/09/2017 FINDINGS: Cardiovascular: Adequate opacification of the pulmonary arteries to the  segmental level. Evaluation beyond the lobar pulmonary arteries is limited by respiratory motion artifact. No evidence of central filling defect to suggest acute pulmonary embolus. However, the main pulmonary artery is dilated at 4 cm consistent with underlying pulmonary arterial hypertension. Normal caliber thoracic aorta. No aortic dissection identified. Conventional 3 vessel arch anatomy. Cardiomegaly, similar compared to prior. No pericardial effusion. Mediastinum/Nodes: Unremarkable CT appearance of the thyroid gland. No suspicious mediastinal or hilar adenopathy. No soft tissue mediastinal mass. The thoracic esophagus is unremarkable. Lungs/Pleura: New small layering left pleural effusion. Combined paraseptal and centrilobular pulmonary emphysema. Dependent atelectasis present in the left lower lobe. No focal airspace consolidation. Moderate respiratory motion artifact. Upper Abdomen: Small calcified spleen. No acute abnormality within the visualized upper abdomen. Musculoskeletal: No acute fracture or aggressive appearing lytic or blastic osseous lesion. Review of the MIP images confirms the above findings. IMPRESSION: 1. Small left layering pleural effusion. Given the patient's clinical symptoms of left chest pain and sickle cell pain crisis, acute pleuritis is a consideration. 2. Negative for acute pulmonary embolus.  3. Mild left lower lobe atelectasis related to the presence of the pleural effusion. No definite airspace consolidation. 4. Persistently enlarged main pulmonary artery suggesting underlying pulmonary arterial hypertension. 5.  Emphysema (ICD10-J43.9). Electronically Signed   By: Malachy Moan M.D.   On: 12/27/2017 12:41   [place] Chest (pleural Effusion)  Result Date: 12/28/2017 CLINICAL DATA:  Small left pleural effusion noted on CT. Left-sided pain. EXAM: CHEST ULTRASOUND COMPARISON:  CT 12/27/2017 FINDINGS: Survey ultrasound of the left hemithorax was performed. Only a trace left pleural  effusion was identified. No significant pocket to allow safe percutaneous thoracentesis. IMPRESSION: Trace residual left pleural effusion. Due to small size, thoracentesis deferred. Electronically Signed   By: Corlis Leak M.D.   On: 12/28/2017 10:37    Microbiology: No results found for this or any previous visit (from the past 240 hour(s)).   Labs: Basic Metabolic Panel: Recent Labs  Lab 12/27/17 1022 12/28/17 0502 12/29/17 0523  NA 141 139 141  K 3.9 3.9 3.9  CL 104 105 106  CO2 28 28 28   GLUCOSE 108* 111* 105*  BUN 13 12 8   CREATININE 0.84 0.93 0.91  CALCIUM 9.8 8.6* 8.9   Liver Function Tests: Recent Labs  Lab 12/27/17 1022 12/28/17 0502 12/29/17 0523  AST 19 14* 16  ALT 13 10 11   ALKPHOS 76 52 58  BILITOT 2.1* 0.9 1.4*  PROT 8.4* 6.3* 6.6  ALBUMIN 4.2 3.1* 3.2*   No results for input(s): LIPASE, AMYLASE in the last 168 hours. No results for input(s): AMMONIA in the last 168 hours. CBC: Recent Labs  Lab 12/27/17 1022 12/28/17 0502 12/29/17 0523  WBC 9.4 6.9 7.0  NEUTROABS 6.9 3.1 3.6  HGB 11.6* 9.0* 9.5*  HCT 33.0* 26.1* 27.5*  MCV 81.5 81.3 81.1  PLT 478* 435* 482*   Cardiac Enzymes: No results for input(s): CKTOTAL, CKMB, CKMBINDEX, TROPONINI in the last 168 hours. BNP: Invalid input(s): POCBNP CBG: No results for input(s): GLUCAP in the last 168 hours.  Time coordinating discharge: 50 minutes  Signed:  Nolon Nations  MSN, FNP-C [facility] [address] [place], [place] [address] [phone]  [facility] 12/29/2017, 11:23 AM

## 2017-12-29 NOTE — Discharge Instructions (Signed)
Resume home medications. Continue anti-inflammatory. Meloxicam 7.5 mg twice daily.  Follow up with primary provider in 1 week.   Referral sent to hematology [facility], Dr. Oretha Milchegina Crawford) Follow up with primary provider in 1 week.   Sickle Cell Anemia, Adult Sickle cell anemia is a condition where your red blood cells are shaped like sickles. Red blood cells carry oxygen through the body. Sickle-shaped red blood cells do not live as long as normal red blood cells. They also clump together and block blood from flowing through the blood vessels. These things prevent the body from getting enough oxygen. Sickle cell anemia causes organ damage and pain. It also increases the risk of infection. Follow these instructions at home:  Drink enough fluid to keep your pee (urine) clear or pale yellow. Drink more in hot weather and during exercise.  Do not smoke. Smoking lowers oxygen levels in the blood.  Only take over-the-counter or prescription medicines as told by your doctor.  Take antibiotic medicines as told by your doctor. Make sure you finish them even if you start to feel better.  Take supplements as told by your doctor.  Consider wearing a medical alert bracelet. This tells anyone caring for you in an emergency of your condition.  When traveling, keep your medical information, doctors' names, and the medicines you take with you at all times.  If you have a fever, do not take fever medicines right away. This could cover up a problem. Tell your doctor.  Keep all follow-up visits with your doctor. Sickle cell anemia requires regular medical care. Contact a doctor if: You have a fever. Get help right away if:  You feel dizzy or faint.  You have new belly (abdominal) pain, especially on the left side near the stomach area.  You have a lasting, often uncomfortable and painful erection of the penis (priapism). If it is not treated right away, you will become unable to have sex  (impotence).  You have numbness in your arms or legs or you have a hard time moving them.  You have a hard time talking.  You have a fever or lasting symptoms for more than 2-3 days.  You have a fever and your symptoms suddenly get worse.  You have signs or symptoms of infection. These include: ? Chills. ? Being more tired than normal (lethargy). ? Irritability. ? Poor eating. ? Throwing up (vomiting).  You have pain that is not helped with medicine.  You have shortness of breath.  You have pain in your chest.  You are coughing up pus-like or bloody mucus.  You have a stiff neck.  Your feet or hands swell or have pain.  Your belly looks bloated.  Your joints hurt. This information is not intended to replace advice given to you by your health care provider. Make sure you discuss any questions you have with your health care provider. Document Released: 03/23/2013 Document Revised: 11/08/2015 Document Reviewed: 01/12/2013 Elsevier Interactive Patient Education  2017 [employer]. Pleural Effusion A pleural effusion is an abnormal buildup of fluid in the layers of tissue between your lungs and the inside of your chest (pleural space). These two layers of tissue that line both your lungs and the inside of your chest are called pleura. Usually, there is no air in the space between the pleura, only a thin layer of fluid. If left untreated, a large amount of fluid can build up and cause the lung to collapse. A pleural effusion is usually caused  by another disease that requires treatment. The two main types of pleural effusion are:  Transudative pleural effusion. This happens when fluid leaks into the pleural space because of a low protein count in your blood or high blood pressure in your vessels. Heart failure often causes this.  Exudative infusion. This occurs when fluid collects in the pleural space from blocked blood vessels or lymph vessels. Some lung diseases, injuries, and  cancers can cause this type of effusion.  What are the causes? Pleural effusion can be caused by:  Heart failure.  A blood clot in the lung (pulmonary embolism).  Pneumonia.  Cancer.  Liver failure (cirrhosis).  Kidney disease.  Complications from surgery, such as from open heart surgery.  What are the signs or symptoms? In some cases, pleural effusion may cause no symptoms. Symptoms can include:  Shortness of breath, especially when lying down.  Chest pain, often worse when taking a deep breath.  Fever.  Dry cough that is lasting (chronic).  Hiccups.  Rapid breathing.  An underlying condition that is causing the pleural effusion (such as heart failure, pneumonia, blood clots, tuberculosis, or cancer) may also cause additional symptoms. How is this diagnosed? Your health care provider may suspect pleural effusion based on your symptoms and medical history. Your health care provider will also do a physical exam and a chest X-ray. If the X-ray shows there is fluid in your chest, you may need to have this fluid removed using a needle (thoracentesis) so it can be tested. You may also have:  Imaging studies of the chest, such as: ? Ultrasound. ? CT scan.  Blood tests for kidney and liver function.  How is this treated? Treatment depends on the cause of the pleural effusion. Treatment may include:  Taking antibiotic medicines to clear up an infection that is causing the pleural effusion.  Placing a tube in the chest to drain the effusion (tube thoracostomy). This procedure is often used when there is an infection in the fluid.  Surgery to remove the fibrous outer layer of tissue from the pleural space (decortication).  Thoracentesis, which can improve cough and shortness of breath.  A procedure to put medicine into the chest cavity to seal the pleural space to prevent fluid buildup (pleurodesis).  Chemotherapy and radiation therapy. These may be required in the  case of cancerous (malignant) pleural effusion.  Follow these instructions at home:  Take medicines only as directed by your health care provider.  Keep track of how long you can gently exercise before you get short of breath. Try simply walking at first.  Do not use any tobacco products, including cigarettes, chewing tobacco, or electronic cigarettes. If you need help quitting, ask your health care provider.  Keep all follow-up visits as directed by your health care provider. This is important. Contact a health care provider if:  The amount of time that you are able to exercise decreases or does not improve with time.  You have pain or signs of infection at the puncture site if you had thoracentesis. Watch for: ? Drainage. ? Redness. ? Swelling.  You have a fever. Get help right away if:  You are short of breath.  You develop chest pain.  You develop a new cough. This information is not intended to replace advice given to you by your health care provider. Make sure you discuss any questions you have with your health care provider. Document Released: 06/02/2005 Document Revised: 11/05/2015 Document Reviewed: 10/26/2013 Elsevier Interactive Patient Education  2018 [employer].

## 2017-12-29 NOTE — Progress Notes (Signed)
Nutrition Brief Note  Patient identified on the Malnutrition Screening Tool (MST) Report  Wt Readings from Last 15 Encounters:  12/27/17 203 lb (92.1 kg)  02/09/17 200 lb (90.7 kg)    Body mass index is 31.79 kg/m. Patient meets criteria for obesity based on current BMI.   Current diet order is regular, patient is consuming approximately 100% of meals at this time. Labs and medications reviewed.   No nutrition interventions warranted at this time. If nutrition issues arise, please consult RD.   Alexandra FrancoLindsey Nikie Cid, MS, RD, LDN [facility] Clinical Dietitian Pager: [phone] After Hours Pager: [phone]

## 2019-04-13 IMAGING — CT CT ANGIO CHEST
2 of 6 series · 17 of 46 positions shown · IV contrast (ISOVUE)
Comparison: Prior CT scan of the chest 02/09/2017

CLINICAL DATA: 61-year-old female with sickle cell pain crisis

EXAM:
CT ANGIOGRAPHY CHEST WITH CONTRAST
TECHNIQUE: Multidetector CT imaging of the chest was performed using the
standard protocol during bolus administration of intravenous
contrast. Multiplanar CT image reconstructions and MIPs were
obtained to evaluate the vascular anatomy.
CONTRAST:  100mL O0XH1U-M17 IOPAMIDOL (O0XH1U-M17) INJECTION 76%

[Series 5: thins · axial · 0.69mm/px · z∈[+613,+848]mm · 14 of 259 slices shown]
[im 12/259  lung]
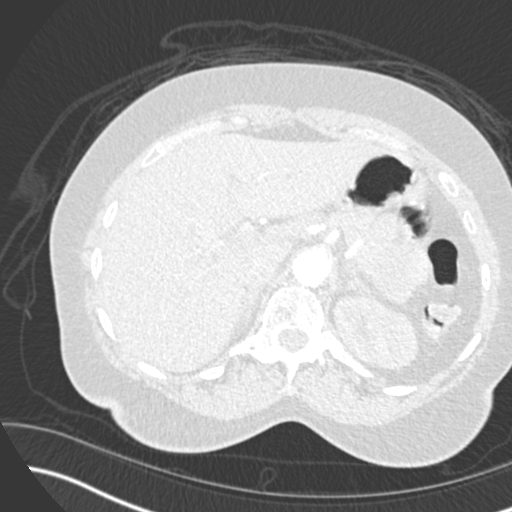
[im 34/259  soft-tissue]
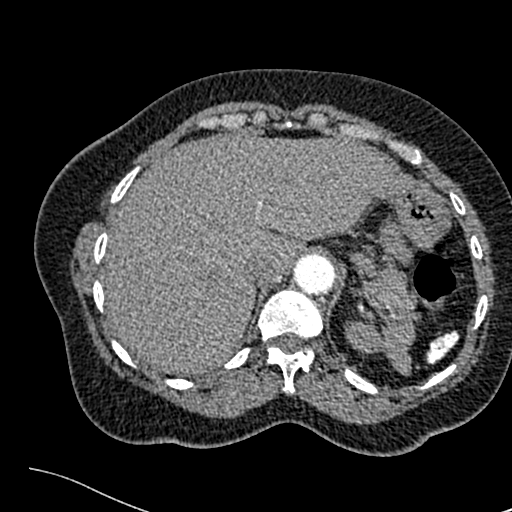
[im 45/259  lung]
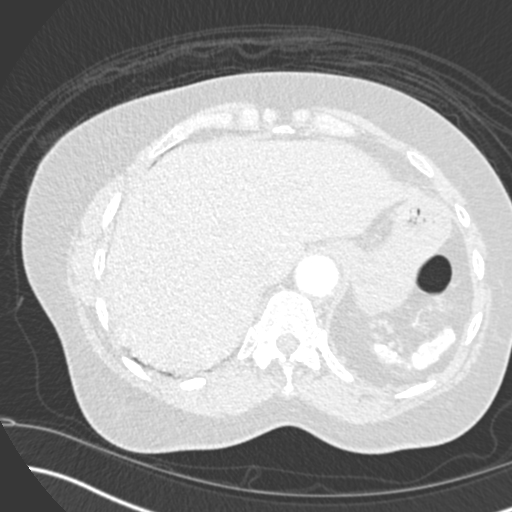
[im 68/259  soft-tissue]
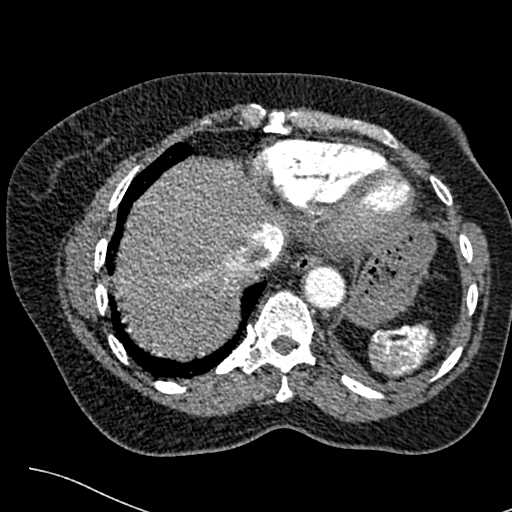
[im 90/259  lung]
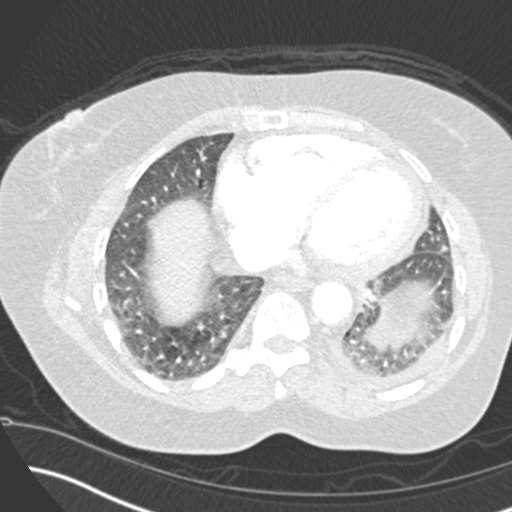
[im 101/259  soft-tissue]
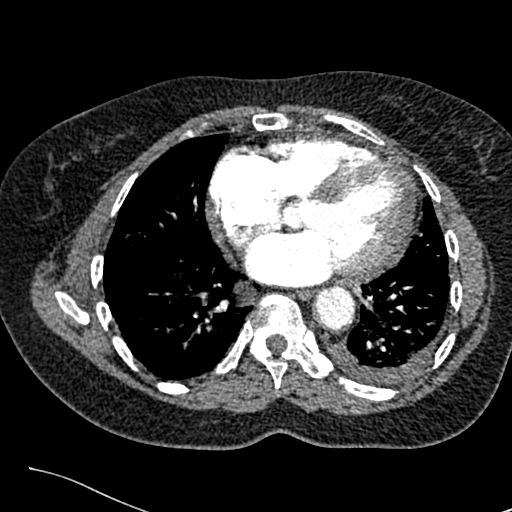
[im 124/259  lung]
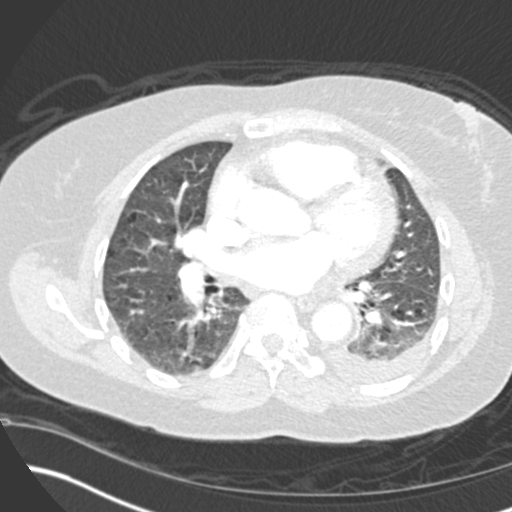
[im 135/259  soft-tissue]
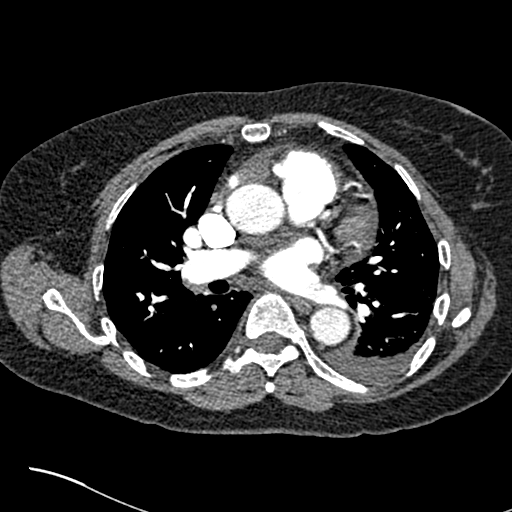
[im 158/259  lung]
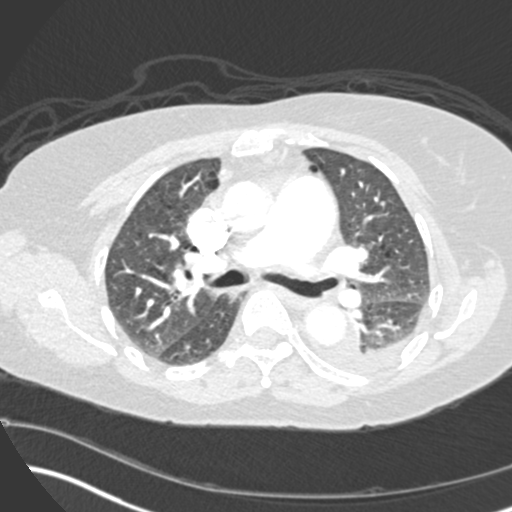
[im 169/259  soft-tissue]
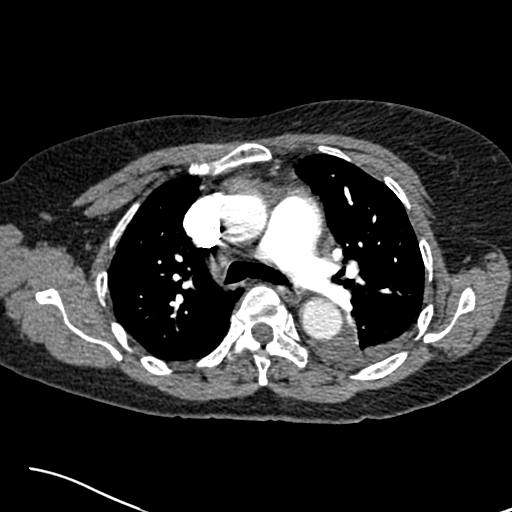
[im 191/259  lung]
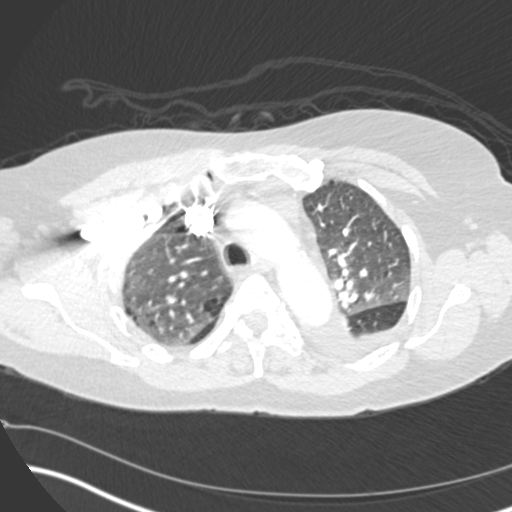
[im 214/259  soft-tissue]
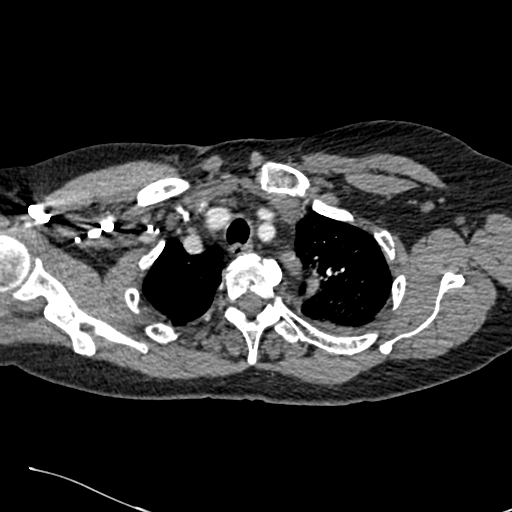
[im 225/259  lung]
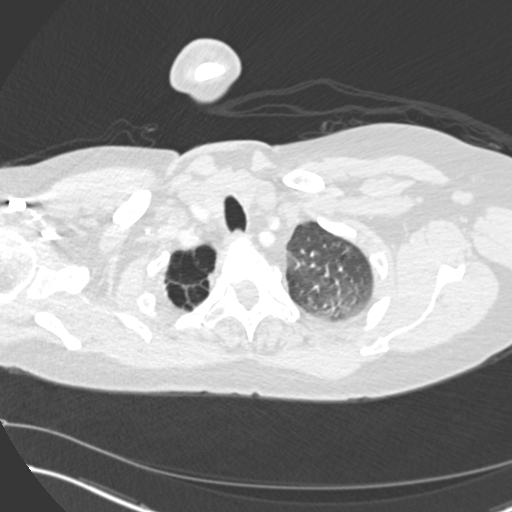
[im 247/259  soft-tissue]
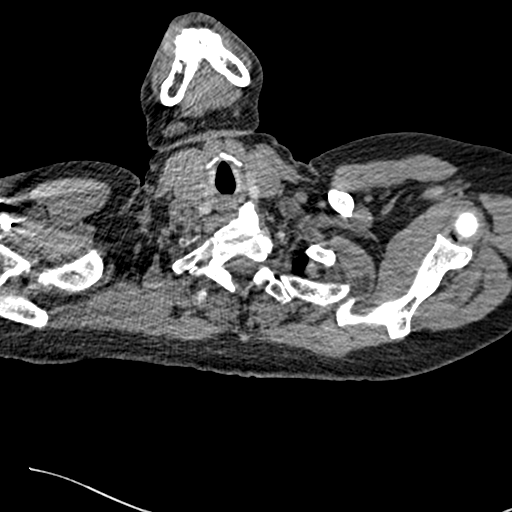

[Series 6: coronal mpr · coronal · 0.54mm/px · 3 of 151 slices shown]
[im 38/151  soft-tissue]
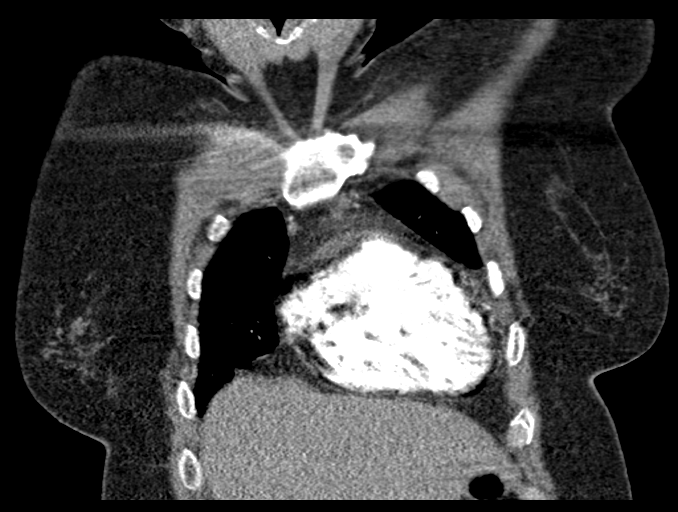
[im 76/151  soft-tissue]
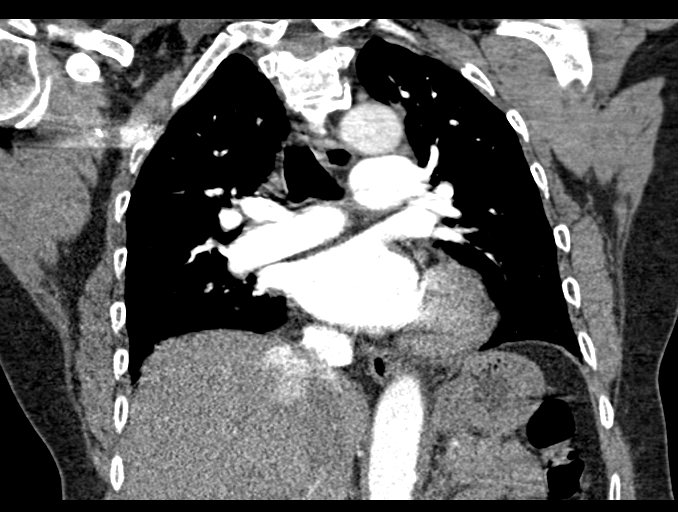
[im 113/151  soft-tissue]
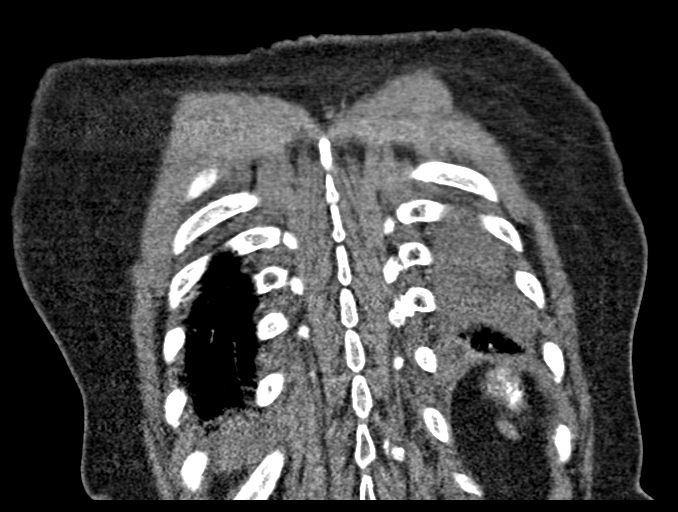

[17 of 46 positions shown; findings below may reference images not displayed]

FINDINGS: Cardiovascular: Adequate opacification of the pulmonary arteries to
the segmental level. Evaluation beyond the lobar pulmonary arteries
is limited by respiratory motion artifact. No evidence of central
filling defect to suggest acute pulmonary embolus. However, the main
pulmonary artery is dilated at 4 cm consistent with underlying
pulmonary arterial hypertension. Normal caliber thoracic aorta. No
aortic dissection identified. Conventional 3 vessel arch anatomy.
Cardiomegaly, similar compared to prior. No pericardial effusion.

Mediastinum/Nodes: Unremarkable CT appearance of the thyroid gland.
No suspicious mediastinal or hilar adenopathy. No soft tissue
mediastinal mass. The thoracic esophagus is unremarkable.

Lungs/Pleura: New small layering left pleural effusion. Combined
paraseptal and centrilobular pulmonary emphysema. Dependent
atelectasis present in the left lower lobe. No focal airspace
consolidation. Moderate respiratory motion artifact.

Upper Abdomen: Small calcified spleen. No acute abnormality within
the visualized upper abdomen.

Musculoskeletal: No acute fracture or aggressive appearing lytic or
blastic osseous lesion.

Review of the MIP images confirms the above findings.
IMPRESSION: 1. Small left layering pleural effusion. Given the patient's
clinical symptoms of left chest pain and sickle cell pain crisis,
acute pleuritis is a consideration.
2. Negative for acute pulmonary embolus.
3. Mild left lower lobe atelectasis related to the presence of the
pleural effusion. No definite airspace consolidation.
4. Persistently enlarged main pulmonary artery suggesting underlying
pulmonary arterial hypertension.
5.  Emphysema ([id number]).

## 2019-09-20 ENCOUNTER — Ambulatory Visit (INDEPENDENT_AMBULATORY_CARE_PROVIDER_SITE_OTHER): Payer: No Typology Code available for payment source | Admitting: Ophthalmology

## 2019-09-20 ENCOUNTER — Other Ambulatory Visit: Payer: Self-pay

## 2019-09-20 ENCOUNTER — Encounter (INDEPENDENT_AMBULATORY_CARE_PROVIDER_SITE_OTHER): Payer: Self-pay | Admitting: Ophthalmology

## 2019-09-20 DIAGNOSIS — D5700 Hb-SS disease with crisis, unspecified: Secondary | ICD-10-CM

## 2019-09-20 DIAGNOSIS — H43821 Vitreomacular adhesion, right eye: Secondary | ICD-10-CM | POA: Insufficient documentation

## 2019-09-20 DIAGNOSIS — H35351 Cystoid macular degeneration, right eye: Secondary | ICD-10-CM | POA: Insufficient documentation

## 2019-09-20 DIAGNOSIS — R0683 Snoring: Secondary | ICD-10-CM | POA: Insufficient documentation

## 2019-09-20 DIAGNOSIS — H35371 Puckering of macula, right eye: Secondary | ICD-10-CM | POA: Diagnosis not present

## 2019-09-20 NOTE — Patient Instructions (Signed)
ENCOURAGE FOLLOW UP WITH PCP  ,, NEEDS FORMAL SLEEP STUDY,, AND THERAPY IF TESTS POSITIVE   Macular telangiectasis (MAC-TEL), or parafoveal telangiectasis is a condition of unknown cause.  Findings in or near the macula (center of vision) consist of microaneurysms (leaking small capillaries), often with leakage of fluid which in the active phase can impact fine discriminatory vision, and in some cases trigger profound scarring in the macula, with severe permanent vision loss.  Standard treatment is observation and periodic examinations to monitor for treatable complications.  However, this practice has discovered an association with sleep apnea with its nightly periods of low oxygen in the blood stream (hypoxia).  More recently, some patients also been found to have advanced lung disease, whether asthma or COPD, with similar findings.  Dr. Zadie Rhine has been evaluating the association of sleep apnea, nightly hypoxia, and Macular telangiectasis for over 18 years.  Most patients are offered apnea screening testing at home and if findings are positive for sleep apnea, more formal, extensive sleep laboratory testing, may be recommended.  Numerous patients, proven to have MAC-TEL, have improved or resolved their condition promptly, within weeks, of the use of nighttime oxygen supplementation or continuous positive airway pressure (CPAP).   Vitreomacular traction may cause vision loss from anatomic distortion to the center of the vision, the macula.  If visual function is symptomatic or threatened, therapy may be needed.  Surgical intervention offers the highest chance of visual stability and improvement.  Distortion of the macula anatomy may cause splitting of the retinal layers, termed foveomacular retinoschisis, which can cause more permanent vision loss.  Epiretinal membranes may also be associated.  Macular hole may also develop if vitreomacular traction progresses. The minor form of this condition is  Vitreomacular adhesion, which is a natural change in the aging process of the eye, which requires observation only.

## 2019-09-20 NOTE — Assessment & Plan Note (Signed)
This mechanical vitreal macular traction might be the cause of the underlying cystoid macular edema noted above however the focal at tractional region is much smaller than the area of of secondary CME.  While this could be macular telangiectasis and/or schisis as noted above the fluorescein angiogram will help to delineate the differences.  If angiographic CME is noted in the fellow eye which has no CME on OCT, it is likely that macular telangiectasis is present.

## 2019-09-20 NOTE — Progress Notes (Signed)
09/20/2019     CHIEF COMPLAINT Patient presents for Blurred Vision   HISTORY OF PRESENT ILLNESS: Alexandra Andrade is a 64 y.o. female who presents to the clinic today for:   HPI    Blurred Vision    In right eye.  Onset was gradual.  Vision is blurred.  Severity is moderate.  This started 3 years ago.  Occurring constantly.  Context:  distance vision, near vision and night driving.  Since onset it is gradually worsening.  Associated symptoms: ROS POSITIVE FOR SLEEP APNEA. SNORING AND WAKING UP 3 TIMES A NIGHT.          Comments    NP exam mac edema OD - Ref'd by Dr. Katy Fitch  Pt c/o gradually worsening VA OD x 3 years. Pt c/o difficulty night driving. Pt c/o floaters off and on OU x 3 years. Pt denies ocular pain OU. Pt c/o intermittent tunnel VA OU. Pt sts VA OS is "a lot stronger" than OD.       Last edited by Hurman Horn, MD on 09/20/2019 11:14 AM. (History)      Referring physician: Chesley Noon, MD [place],  [place] [address]  HISTORICAL INFORMATION:   Selected notes from the MEDICAL RECORD NUMBER    CURRENT MEDICATIONS: Current Outpatient Medications (Ophthalmic Drugs)  Medication Sig  . trimethoprim-polymyxin b (POLYTRIM) ophthalmic solution    No current facility-administered medications for this visit. (Ophthalmic Drugs)   Current Outpatient Medications (Other)  Medication Sig  . doxycycline (VIBRAMYCIN) 100 MG capsule One tablet twice a day  . fluticasone furoate-vilanterol (BREO ELLIPTA) 100-25 MCG/INH AEPB Inhale into the lungs.  Marland Kitchen oxyCODONE-acetaminophen (PERCOCET) 7.5-325 MG tablet Take by mouth.  . Albuterol Sulfate 108 (90 Base) MCG/ACT AEPB Inhale 1 puff into the lungs every 6 (six) hours as needed for wheezing.  Marland Kitchen amLODipine (NORVASC) 10 MG tablet Take 10 mg by mouth daily.  . folic acid (FOLVITE) 1 MG tablet Take 1-2 mg by mouth daily.  . hydrochlorothiazide (HYDRODIURIL) 25 MG tablet Take 25 mg by mouth daily.  . meloxicam  (MOBIC) 15 MG tablet Take 15 mg by mouth daily.  Marland Kitchen olmesartan (BENICAR) 40 MG tablet Take 40 mg by mouth daily.  . Olmesartan-amLODIPine-HCTZ 40-10-25 MG TABS Take 1 tablet by mouth daily.  . traMADol (ULTRAM) 50 MG tablet Take 50 mg by mouth every 6 (six) hours as needed for moderate pain.   No current facility-administered medications for this visit. (Other)      REVIEW OF SYSTEMS:    ALLERGIES No Known Allergies  PAST MEDICAL HISTORY Past Medical History:  Diagnosis Date  . Anemia   . Emphysema of lung ([place])   . Glaucoma 2010  . Hypertension   . Pulmonary embolism ([place])   . Recurrent sinus infections   . Sickle cell anemia ([facility])   . Spleen enlarged 2008   Past Surgical History:  Procedure Laterality Date  . ABDOMINAL HYSTERECTOMY    . CHOLECYSTECTOMY      FAMILY HISTORY Family History  Problem Relation Age of Onset  . Dementia Mother   . Cancer Father   . Sickle cell trait Son   . Lupus Sister     SOCIAL HISTORY Social History   Tobacco Use  . Smoking status: Former Smoker    Types: Cigarettes    Quit date: 02/09/2005    Years since quitting: 14.6  . Smokeless tobacco: Never Used  Substance Use Topics  . Alcohol  use: No  . Drug use: Yes    Types: Marijuana    Comment: uses for pain         OPHTHALMIC EXAM:  Base Eye Exam    Visual Acuity (Snellen - Linear)      Right Left   Dist cc 20/40 20/30 -1   Dist ph cc NI 20/20 -1   Correction: Glasses       Tonometry (Tonopen, 9:43 AM)      Right Left   Pressure 13 13       Pupils      Pupils Dark Light Shape React APD   Right PERRL 4 3 Round Brisk None   Left PERRL 4 3 Round Brisk None       Visual Fields (Counting fingers)      Left Right    Full Full       Extraocular Movement      Right Left    Full Full       Neuro/Psych    Oriented x3: Yes   Mood/Affect: Normal       Dilation    Both eyes: 1.0% Mydriacyl, 2.5% Phenylephrine @ 9:43 AM        Slit Lamp and Fundus  Exam    Slit Lamp Exam      Right Left   Lids/Lashes Normal Normal   Conjunctiva/Sclera White and quiet White and quiet   Cornea Clear Clear   Anterior Chamber Deep and quiet Deep and quiet   Iris Round and reactive Round and reactive   Lens 2+ Nuclear sclerosis 2+ Nuclear sclerosis   Vitreous Normal Normal       Fundus Exam      Right Left   Disc Venous loop on nerve Normal   C/D Ratio 0.35 0.4   Macula pseudocystoid changes, no mas Normal   Vessels Tortuous all veins ? Mild dilation Normal   Periphery Normal Normal          IMAGING AND PROCEDURES  Imaging and Procedures for 09/20/19  OCT, Retina - OU - Both Eyes       Right Eye Quality was good. Scan locations included subfoveal. Findings include cystoid macular edema, vitreomacular adhesion .   Left Eye Quality was good. Scan locations included subfoveal. Findings include normal foveal contour.                 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 Cystoid macular edema of right eye  H35.351 OCT, Retina - OU - Both Eyes  3. Right epiretinal membrane  H35.371   4. Snores  R06.83   5. 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  2.  3.  Ophthalmic Meds Ordered this visit:  No orders of the defined types were placed in this encounter.      Return in about 1 week (around 09/27/2019) for COLOR FP, FFA OPTOS R\L.  Patient Instructions  ENCOURAGE FOLLOW UP WITH PCP  ,, NEEDS FORMAL SLEEP STUDY,, AND THERAPY IF TESTS POSITIVE   Macular telangiectasis (MAC-TEL), or parafoveal telangiectasis is a condition of unknown cause.  Findings in or near the macula (center of vision) consist of microaneurysms (leaking small capillaries), often with leakage of fluid which in the active phase can impact fine discriminatory vision, and in some cases trigger profound scarring in the macula, with severe permanent  vision  loss.  Standard treatment is observation and periodic examinations to monitor for treatable complications.  However, this practice has discovered an association with sleep apnea with its nightly periods of low oxygen in the blood stream (hypoxia).  More recently, some patients also been found to have advanced lung disease, whether asthma or COPD, with similar findings.  Dr. Zadie Rhine has been evaluating the association of sleep apnea, nightly hypoxia, and Macular telangiectasis for over 18 years.  Most patients are offered apnea screening testing at home and if findings are positive for sleep apnea, more formal, extensive sleep laboratory testing, may be recommended.  Numerous patients, proven to have MAC-TEL, have improved or resolved their condition promptly, within weeks, of the use of nighttime oxygen supplementation or continuous positive airway pressure (CPAP).   Vitreomacular traction may cause vision loss from anatomic distortion to the center of the vision, the macula.  If visual function is symptomatic or threatened, therapy may be needed.  Surgical intervention offers the highest chance of visual stability and improvement.  Distortion of the macula anatomy may cause splitting of the retinal layers, termed foveomacular retinoschisis, which can cause more permanent vision loss.  Epiretinal membranes may also be associated.  Macular hole may also develop if vitreomacular traction progresses. The minor form of this condition is Vitreomacular adhesion, which is a natural change in the aging process of the eye, which requires observation only.    Explained the diagnoses, plan, and follow up with the patient and they expressed understanding.  Patient expressed understanding of the importance of proper follow up care.   Clent Demark Pavielle Biggar M.D. Diseases & Surgery of the Retina and Vitreous [facility] 09/20/19     Abbreviations: M myopia (nearsighted); A astigmatism; H hyperopia  (farsighted); P presbyopia; Mrx spectacle prescription;  CTL contact lenses; OD right eye; OS left eye; OU both eyes  XT exotropia; ET esotropia; PEK punctate epithelial keratitis; PEE punctate epithelial erosions; DES dry eye syndrome; MGD meibomian gland dysfunction; ATs artificial tears; PFAT's preservative free artificial tears; [place] nuclear sclerotic cataract; PSC posterior subcapsular cataract; ERM epi-retinal membrane; PVD posterior vitreous detachment; RD retinal detachment; DM diabetes mellitus; DR diabetic retinopathy; NPDR non-proliferative diabetic retinopathy; PDR proliferative diabetic retinopathy; CSME clinically significant macular edema; DME diabetic macular edema; dbh dot blot hemorrhages; CWS cotton wool spot; POAG primary open angle glaucoma; C/D cup-to-disc ratio; HVF humphrey visual field; GVF goldmann visual field; OCT optical coherence tomography; IOP intraocular pressure; BRVO Branch retinal vein occlusion; CRVO central retinal vein occlusion; CRAO central retinal artery occlusion; BRAO branch retinal artery occlusion; RT retinal tear; SB scleral buckle; PPV pars plana vitrectomy; VH Vitreous hemorrhage; PRP panretinal laser photocoagulation; IVK intravitreal kenalog; VMT vitreomacular traction; MH Macular hole;  NVD neovascularization of the disc; NVE neovascularization elsewhere; AREDS age related eye disease study; ARMD age related macular degeneration; POAG primary open angle glaucoma; EBMD epithelial/anterior basement membrane dystrophy; ACIOL anterior chamber intraocular lens; IOL intraocular lens; PCIOL posterior chamber intraocular lens; Phaco/IOL phacoemulsification with intraocular lens placement; [place] photorefractive keratectomy; LASIK laser assisted in situ keratomileusis; HTN hypertension; DM diabetes mellitus; COPD chronic obstructive pulmonary disease

## 2019-09-20 NOTE — Assessment & Plan Note (Signed)
This could be simple vitreal macular traction resulting in secondary microcystoid macular edema.  This type of macular edema can also be seen in macular telangiectasis.  Patient does have the body habitus and the review of systems are positive for sleep apnea.  I have recommended a formal sleep study and discussed the potential benefits of such.  She should return next week for proper fluorescein angiography to delineate the nature of the cystoid findings seen on OCT today.

## 2019-09-29 ENCOUNTER — Other Ambulatory Visit: Payer: Self-pay

## 2019-09-29 ENCOUNTER — Encounter (INDEPENDENT_AMBULATORY_CARE_PROVIDER_SITE_OTHER): Payer: Self-pay | Admitting: Ophthalmology

## 2019-09-29 ENCOUNTER — Ambulatory Visit (INDEPENDENT_AMBULATORY_CARE_PROVIDER_SITE_OTHER): Payer: No Typology Code available for payment source | Admitting: Ophthalmology

## 2019-09-29 DIAGNOSIS — H43821 Vitreomacular adhesion, right eye: Secondary | ICD-10-CM | POA: Diagnosis not present

## 2019-09-29 DIAGNOSIS — H35351 Cystoid macular degeneration, right eye: Secondary | ICD-10-CM

## 2019-09-29 DIAGNOSIS — H3521 Other non-diabetic proliferative retinopathy, right eye: Secondary | ICD-10-CM | POA: Diagnosis not present

## 2019-09-29 DIAGNOSIS — H3522 Other non-diabetic proliferative retinopathy, left eye: Secondary | ICD-10-CM | POA: Diagnosis not present

## 2019-09-29 MED ORDER — FLUORESCEIN SODIUM 10 % IV SOLN
500.0000 mg | INTRAVENOUS | Status: AC | PRN
  Administered 2019-09-29: 14:00:00 500 mg via INTRAVENOUS

## 2019-09-29 NOTE — Patient Instructions (Signed)
   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

## 2019-09-29 NOTE — Progress Notes (Signed)
09/29/2019     CHIEF COMPLAINT Patient presents for Retina Follow Up   HISTORY OF PRESENT ILLNESS: Alexandra Andrade is a 64 y.o. female who presents to the clinic today for:   HPI    Retina Follow Up    Patient presents with  Other.  In both eyes.  Severity is moderate.  Since onset it is stable.  I, the attending physician,  performed the HPI with the patient and updated documentation appropriately.          Comments    1 Week f\u OU - FFA R\L. FP  Pt states no changes since last visit.       Last edited by Elyse Jarvis on 09/29/2019  1:06 PM. (History)      Referring physician: Eartha Inch, MD [address] [place],  [place] [address]  HISTORICAL INFORMATION:   Selected notes from the MEDICAL RECORD NUMBER       CURRENT MEDICATIONS: Current Outpatient Medications (Ophthalmic Drugs)  Medication Sig  . trimethoprim-polymyxin b (POLYTRIM) ophthalmic solution    No current facility-administered medications for this visit. (Ophthalmic Drugs)   Current Outpatient Medications (Other)  Medication Sig  . Albuterol Sulfate 108 (90 Base) MCG/ACT AEPB Inhale 1 puff into the lungs every 6 (six) hours as needed for wheezing.  Marland Kitchen amLODipine (NORVASC) 10 MG tablet Take 10 mg by mouth daily.  . Ascorbic Acid 1500 MG TBCR Take by mouth.  . doxycycline (VIBRAMYCIN) 100 MG capsule One tablet twice a day  . fluticasone furoate-vilanterol (BREO ELLIPTA) 100-25 MCG/INH AEPB Inhale into the lungs.  . folic acid (FOLVITE) 1 MG tablet Take 1-2 mg by mouth daily.  . hydrochlorothiazide (HYDRODIURIL) 25 MG tablet Take 25 mg by mouth daily.  . meloxicam (MOBIC) 15 MG tablet Take 15 mg by mouth daily.  Marland Kitchen olmesartan (BENICAR) 40 MG tablet Take 40 mg by mouth daily.  . Olmesartan-amLODIPine-HCTZ 40-10-25 MG TABS Take 1 tablet by mouth daily.  Marland Kitchen oxyCODONE-acetaminophen (PERCOCET) 7.5-325 MG tablet Take by mouth.  . traMADol (ULTRAM) 50 MG tablet Take 50 mg by mouth every 6  (six) hours as needed for moderate pain.  Marland Kitchen VITAMIN A EX Apply topically.  Marland Kitchen VITAMIN E COMPLEX PO Take by mouth.   No current facility-administered medications for this visit. (Other)      REVIEW OF SYSTEMS:    ALLERGIES No Known Allergies  PAST MEDICAL HISTORY Past Medical History:  Diagnosis Date  . Anemia   . Emphysema of lung (HCC)   . Glaucoma 2010  . Hypertension   . Pulmonary embolism (HCC)   . Recurrent sinus infections   . Sickle cell anemia (HCC)   . Spleen enlarged 2008   Past Surgical History:  Procedure Laterality Date  . ABDOMINAL HYSTERECTOMY    . CHOLECYSTECTOMY      FAMILY HISTORY Family History  Problem Relation Age of Onset  . Dementia Mother   . Cancer Father   . Sickle cell trait Son   . Lupus Sister     SOCIAL HISTORY Social History   Tobacco Use  . Smoking status: Former Smoker    Types: Cigarettes    Quit date: 02/09/2005    Years since quitting: 14.6  . Smokeless tobacco: Never Used  Substance Use Topics  . Alcohol use: No  . Drug use: Yes    Types: Marijuana    Comment: uses for pain         OPHTHALMIC EXAM:  Base  Eye Exam    Visual Acuity (Snellen - Linear)      Right Left   Dist cc 20/30 -1 20/25 -1       Tonometry (Tonopen, 1:13 PM)      Right Left   Pressure 15 18       Pupils      Pupils Dark Light Shape React APD   Right PERRL 4 3 Round Brisk None   Left PERRL 4 3 Round Brisk None       Visual Fields (Counting fingers)      Left Right    Full Full       Neuro/Psych    Oriented x3: Yes   Mood/Affect: Normal       Dilation    Both eyes: 1.0% Mydriacyl, 2.5% Phenylephrine @ 1:13 PM        Slit Lamp and Fundus Exam    Slit Lamp Exam      Right Left   Lids/Lashes Normal Normal   Conjunctiva/Sclera White and quiet White and quiet   Cornea Clear Clear   Anterior Chamber Deep and quiet Deep and quiet   Iris Round and reactive Round and reactive   Lens 2+ Nuclear sclerosis 2+ Nuclear  sclerosis   Vitreous Normal Normal       Fundus Exam      Right Left   Disc Venous loop on nerve Normal   C/D Ratio 0.35 0.4   Macula pseudocystoid changes, no mas Normal   Vessels Tortuous all veins ? Mild dilation Normal   Periphery Normal Normal          IMAGING AND PROCEDURES  Imaging and Procedures for @TODAY @  Color Fundus Photography Optos - OU - Both Eyes       Right Eye Disc findings include normal observations. Macula : normal observations. Vessels : Neovascularization. Periphery : neovascularization.   Left Eye Disc findings include normal observations. Macula : normal observations. Periphery : neovascularization.   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  OS with similar retinal peripheral nonperfusion inferort temporal, with retinal neovascularization                ASSESSMENT/PLAN:  No problem-specific Assessment & Plan notes found for this encounter.      ICD-10-CM   1. Vitreomacular adhesion of right eye  H43.821 Fluorescein Angiography Optos (Transit OD)    Color Fundus Photography Optos - OU - Both Eyes  2. Cystoid macular edema of right eye  H35.351 Fluorescein Angiography Optos (Transit OD)    Color Fundus Photography Optos - OU - Both Eyes  3. Nondiabetic proliferative retinopathy of left eye  H35.22   4. Nondiabetic proliferative retinopathy, right  H35.21     1.  2.  3.  Ophthalmic Meds Ordered this visit:  No orders of the defined types were placed in this encounter.      Return in about 1 week (around 10/06/2019) for PRP, OS.  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  Patient expressed understanding of  the importance of proper follow up care.   10/08/2019 Rielly Brunn M.D. Diseases & Surgery of the Retina and Vitreous [facility] @TODAY @     Abbreviations: [place]  myopia (nearsighted); A astigmatism; H hyperopia (farsighted); P presbyopia; Mrx spectacle prescription;  CTL contact lenses; OD right eye; OS left eye; OU both eyes  XT exotropia; ET esotropia; PEK punctate epithelial keratitis; PEE punctate epithelial erosions; DES dry eye syndrome; MGD meibomian gland dysfunction; ATs artificial tears; PFAT's preservative free artificial tears; [place] nuclear sclerotic cataract; PSC posterior subcapsular cataract; ERM epi-retinal membrane; PVD posterior vitreous detachment; RD retinal detachment; DM diabetes mellitus; DR diabetic retinopathy; NPDR non-proliferative diabetic retinopathy; PDR proliferative diabetic retinopathy; CSME clinically significant macular edema; DME diabetic macular edema; dbh dot blot hemorrhages; CWS cotton wool spot; POAG primary open angle glaucoma; C/D cup-to-disc ratio; HVF humphrey visual field; GVF goldmann visual field; OCT optical coherence tomography; IOP intraocular pressure; BRVO Branch retinal vein occlusion; CRVO central retinal vein occlusion; CRAO central retinal artery occlusion; BRAO branch retinal artery occlusion; RT retinal tear; SB scleral buckle; PPV pars plana vitrectomy; VH Vitreous hemorrhage; PRP panretinal laser photocoagulation; IVK intravitreal kenalog; VMT vitreomacular traction; MH Macular hole;  NVD neovascularization of the disc; NVE neovascularization elsewhere; AREDS age related eye disease study; ARMD age related macular degeneration; POAG primary open angle glaucoma; EBMD epithelial/anterior basement membrane dystrophy; ACIOL anterior chamber intraocular lens; IOL intraocular lens; PCIOL posterior chamber intraocular lens; Phaco/IOL phacoemulsification with intraocular lens placement; [place] photorefractive keratectomy; LASIK laser assisted in situ  keratomileusis; HTN hypertension; DM diabetes mellitus; COPD chronic obstructive pulmonary disease

## 2019-10-10 ENCOUNTER — Other Ambulatory Visit: Payer: Self-pay

## 2019-10-10 ENCOUNTER — Encounter (INDEPENDENT_AMBULATORY_CARE_PROVIDER_SITE_OTHER): Payer: Self-pay | Admitting: Ophthalmology

## 2019-10-10 ENCOUNTER — Ambulatory Visit (INDEPENDENT_AMBULATORY_CARE_PROVIDER_SITE_OTHER): Payer: No Typology Code available for payment source | Admitting: Ophthalmology

## 2019-10-10 DIAGNOSIS — H3522 Other non-diabetic proliferative retinopathy, left eye: Secondary | ICD-10-CM | POA: Insufficient documentation

## 2019-10-11 ENCOUNTER — Encounter (INDEPENDENT_AMBULATORY_CARE_PROVIDER_SITE_OTHER): Payer: No Typology Code available for payment source | Admitting: Ophthalmology

## 2019-10-13 ENCOUNTER — Encounter (INDEPENDENT_AMBULATORY_CARE_PROVIDER_SITE_OTHER): Payer: No Typology Code available for payment source | Admitting: Ophthalmology

## 2019-10-17 ENCOUNTER — Other Ambulatory Visit: Payer: Self-pay

## 2019-10-17 ENCOUNTER — Ambulatory Visit (INDEPENDENT_AMBULATORY_CARE_PROVIDER_SITE_OTHER): Payer: No Typology Code available for payment source | Admitting: Ophthalmology

## 2019-10-17 ENCOUNTER — Encounter (INDEPENDENT_AMBULATORY_CARE_PROVIDER_SITE_OTHER): Payer: Self-pay | Admitting: Ophthalmology

## 2019-10-17 DIAGNOSIS — D5720 Sickle-cell/Hb-C disease without crisis: Secondary | ICD-10-CM | POA: Insufficient documentation

## 2019-10-17 DIAGNOSIS — H3521 Other non-diabetic proliferative retinopathy, right eye: Secondary | ICD-10-CM | POA: Diagnosis not present

## 2019-10-17 MED ORDER — BACITRACIN-POLYMYXIN B 500-10000 UNIT/GM OP OINT: TOPICAL_OINTMENT | Freq: Every evening | OPHTHALMIC | 0 refills | Status: AC | PRN

## 2019-10-17 NOTE — Progress Notes (Signed)
10/17/2019     CHIEF COMPLAINT Patient presents for Retina Follow Up   HISTORY OF PRESENT ILLNESS: Alexandra Andrade is a 63 y.o. female who presents to the clinic today for:   HPI    Retina Follow Up    Patient presents with  Other.  In right eye.  Duration of 1 week.  Since onset it is stable.          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 Patient states she also wakes up with her left eye crusted shut.       Last edited by Hurman Horn, MD on 10/17/2019  4:05 PM. (History)      Referring physician: Chesley Noon, MD [place],   [id number]  HISTORICAL INFORMATION:   Selected notes from the MEDICAL RECORD NUMBER       CURRENT MEDICATIONS: Current Outpatient Medications (Ophthalmic Drugs)  Medication Sig  . bacitracin-polymyxin b (POLYSPORIN) ophthalmic ointment Place into the left eye at bedtime as needed for up to 10 days. Place a 1/2 inch ribbon of ointment into the lower eyelid.  Marland Kitchen trimethoprim-polymyxin b (POLYTRIM) ophthalmic solution    No current facility-administered medications for this visit. (Ophthalmic Drugs)   Current Outpatient Medications (Other)  Medication Sig  . Albuterol Sulfate 108 (90 Base) MCG/ACT AEPB Inhale 1 puff into the lungs every 6 (six) hours as needed for wheezing.  Marland Kitchen amLODipine (NORVASC) 10 MG tablet Take 10 mg by mouth daily.  . Ascorbic Acid 1500 MG TBCR Take by mouth.  . doxycycline (VIBRAMYCIN) 100 MG capsule One tablet twice a day  . fluticasone furoate-vilanterol (BREO ELLIPTA) 100-25 MCG/INH AEPB Inhale into the lungs.  . folic acid (FOLVITE) 1 MG tablet Take 1-2 mg by mouth daily.  . hydrochlorothiazide (HYDRODIURIL) 25 MG tablet Take 25 mg by mouth daily.  . meloxicam (MOBIC) 15 MG tablet Take 15  mg by mouth daily.  Marland Kitchen olmesartan (BENICAR) 40 MG tablet Take 40 mg by mouth daily.  . Olmesartan-amLODIPine-HCTZ 40-10-25 MG TABS Take 1 tablet by mouth daily.  Marland Kitchen oxyCODONE-acetaminophen (PERCOCET) 7.5-325 MG tablet Take by mouth.  . traMADol (ULTRAM) 50 MG tablet Take 50 mg by mouth every 6 (six) hours as needed for moderate pain.  Marland Kitchen VITAMIN A EX Apply topically.  Marland Kitchen VITAMIN E COMPLEX PO Take by mouth.   No current facility-administered medications for this visit. (Other)      REVIEW OF SYSTEMS:    ALLERGIES No Known Allergies  PAST MEDICAL HISTORY Past Medical History:  Diagnosis Date  . Anemia   . Emphysema of lung ([place])   . Glaucoma 2010  . Hypertension   . Pulmonary embolism ([place])   . Recurrent sinus infections   . Sickle cell anemia (HCC)   . Spleen enlarged 2008   Past Surgical History:  Procedure Laterality Date  . ABDOMINAL HYSTERECTOMY    . CHOLECYSTECTOMY      FAMILY HISTORY Family History  Problem Relation Age of Onset  . Dementia Mother   . Cancer Father   . Sickle cell trait Son   . Lupus Sister     SOCIAL HISTORY Social History   Tobacco Use  . Smoking status: Former Smoker    Types: Cigarettes    Quit  date: 02/09/2005    Years since quitting: 14.6  . Smokeless tobacco: Never Used  Substance Use Topics  . Alcohol use: No  . Drug use: Yes    Types: Marijuana    Comment: uses for pain         OPHTHALMIC EXAM:  Base Eye Exam    Visual Acuity (Snellen - Linear)      Right Left   Dist cc 20/25-2 20/25+1   Correction: Glasses       Tonometry (Tonopen, 3:03 PM)      Right Left   Pressure 14 13       Neuro/Psych    Oriented x3: Yes   Mood/Affect: Normal       Dilation    Right eye: 1.0% Mydriacyl, 2.5% Phenylephrine @ 3:03 PM        Slit Lamp and Fundus Exam    External Exam      Right Left   External Normal Normal       Slit Lamp Exam      Right Left   Lids/Lashes Normal Normal   Conjunctiva/Sclera White and  quiet White and quiet   Cornea Clear Clear   Anterior Chamber Deep and quiet Deep and quiet   Iris Round and reactive Round and reactive   Vitreous Normal Normal          IMAGING AND PROCEDURES  Imaging and Procedures for 10/17/19  Panretinal Photocoagulation - OD - Right Eye       Time Out Confirmed correct patient, procedure, site, and patient consented.   Anesthesia Topical anesthesia was used. Anesthetic medications included Proparacaine 0.5%.   Laser Information The type of laser was diode. Color was yellow. The duration in seconds was 20. The spot size was 390 microns. Laser power was 260. Total spots was 217.   Post-op The patient tolerated the procedure well. There were no complications. The patient received written and verbal post procedure care education.   Notes OD, peripheral retinal nonperfusion treated temporal to large seafan retinopathy inferotemporal arcade distally                ASSESSMENT/PLAN:  No problem-specific Assessment & Plan notes found for this encounter.      ICD-10-CM   1. Nondiabetic proliferative retinopathy, right  H35.21 Panretinal Photocoagulation - OD - Right Eye  2. Sickle cell-hemoglobin C disease without crisis (HCC)  D57.20     1.  2.  3.  Ophthalmic Meds Ordered this visit:  Meds ordered this encounter  Medications  . bacitracin-polymyxin b (POLYSPORIN) ophthalmic ointment    Sig: Place into the left eye at bedtime as needed for up to 10 days. Place a 1/2 inch ribbon of ointment into the lower eyelid.    Dispense:  3.5 g    Refill:  0       Return in about 4 months (around 02/17/2020) for DILATE OU, COLOR FP.  There are no Patient Instructions on file for this visit.   Explained the diagnoses, plan, and follow up with the patient and they expressed understanding.  Patient expressed understanding of the importance of proper follow up care.   [facility] [place]land Jalisia Puchalski M.D. Diseases & Surgery of the Retina and  Vitreous [facility] 10/17/19     Abbreviations: M myopia (nearsighted); A astigmatism; H hyperopia (farsighted); P presbyopia; Mrx spectacle prescription;  CTL contact lenses; OD right eye; OS left eye; OU both eyes  XT exotropia; ET esotropia; PEK punctate  epithelial keratitis; PEE punctate epithelial erosions; DES dry eye syndrome; MGD meibomian gland dysfunction; ATs artificial tears; PFAT's preservative free artificial tears; [place] nuclear sclerotic cataract; PSC posterior subcapsular cataract; ERM epi-retinal membrane; PVD posterior vitreous detachment; RD retinal detachment; DM diabetes mellitus; DR diabetic retinopathy; NPDR non-proliferative diabetic retinopathy; PDR proliferative diabetic retinopathy; CSME clinically significant macular edema; DME diabetic macular edema; dbh dot blot hemorrhages; CWS cotton wool spot; POAG primary open angle glaucoma; C/D cup-to-disc ratio; HVF humphrey visual field; GVF goldmann visual field; OCT optical coherence tomography; IOP intraocular pressure; BRVO Branch retinal vein occlusion; CRVO central retinal vein occlusion; CRAO central retinal artery occlusion; BRAO branch retinal artery occlusion; RT retinal tear; SB scleral buckle; PPV pars plana vitrectomy; VH Vitreous hemorrhage; PRP panretinal laser photocoagulation; IVK intravitreal kenalog; VMT vitreomacular traction; MH Macular hole;  NVD neovascularization of the disc; NVE neovascularization elsewhere; AREDS age related eye disease study; ARMD age related macular degeneration; POAG primary open angle glaucoma; EBMD epithelial/anterior basement membrane dystrophy; ACIOL anterior chamber intraocular lens; IOL intraocular lens; PCIOL posterior chamber intraocular lens; Phaco/IOL phacoemulsification with intraocular lens placement; [place] photorefractive keratectomy; LASIK laser assisted in situ keratomileusis; HTN hypertension; DM diabetes mellitus; COPD chronic obstructive pulmonary disease

## 2020-02-16 ENCOUNTER — Encounter (INDEPENDENT_AMBULATORY_CARE_PROVIDER_SITE_OTHER): Payer: Self-pay

## 2020-02-16 ENCOUNTER — Encounter (INDEPENDENT_AMBULATORY_CARE_PROVIDER_SITE_OTHER): Payer: No Typology Code available for payment source | Admitting: Ophthalmology

## 2020-02-17 ENCOUNTER — Encounter (INDEPENDENT_AMBULATORY_CARE_PROVIDER_SITE_OTHER): Payer: No Typology Code available for payment source | Admitting: Ophthalmology
